# Patient Record
Sex: Female | Born: 1985 | Race: White | Hispanic: No | Marital: Single | Smoking: Never smoker
Health system: Southern US, Community
[De-identification: ages and names within clinical notes are randomized; demographics above are authoritative.]

## PROBLEM LIST (undated history)

## (undated) DIAGNOSIS — M1990 Unspecified osteoarthritis, unspecified site: Secondary | ICD-10-CM

## (undated) DIAGNOSIS — K8050 Calculus of bile duct without cholangitis or cholecystitis without obstruction: Secondary | ICD-10-CM

## (undated) DIAGNOSIS — Z973 Presence of spectacles and contact lenses: Secondary | ICD-10-CM

## (undated) DIAGNOSIS — R51 Headache: Secondary | ICD-10-CM

## (undated) DIAGNOSIS — K219 Gastro-esophageal reflux disease without esophagitis: Secondary | ICD-10-CM

## (undated) DIAGNOSIS — J45909 Unspecified asthma, uncomplicated: Secondary | ICD-10-CM

## (undated) DIAGNOSIS — E282 Polycystic ovarian syndrome: Secondary | ICD-10-CM

## (undated) DIAGNOSIS — G4730 Sleep apnea, unspecified: Secondary | ICD-10-CM

## (undated) DIAGNOSIS — M19019 Primary osteoarthritis, unspecified shoulder: Secondary | ICD-10-CM

## (undated) DIAGNOSIS — K439 Ventral hernia without obstruction or gangrene: Secondary | ICD-10-CM

## (undated) DIAGNOSIS — E669 Obesity, unspecified: Secondary | ICD-10-CM

## (undated) DIAGNOSIS — R519 Headache, unspecified: Secondary | ICD-10-CM

## (undated) HISTORY — DX: Polycystic ovarian syndrome: E28.2

## (undated) HISTORY — PX: COLONOSCOPY WITH ESOPHAGOGASTRODUODENOSCOPY (EGD): SHX5779

## (undated) HISTORY — PX: WISDOM TOOTH EXTRACTION: SHX21

## (undated) HISTORY — PX: SHOULDER SURGERY: SHX246

---

## 2008-08-18 ENCOUNTER — Ambulatory Visit (HOSPITAL_BASED_OUTPATIENT_CLINIC_OR_DEPARTMENT_OTHER): Admission: RE | Admit: 2008-08-18 | Discharge: 2008-08-18 | Payer: Self-pay | Admitting: Family Medicine

## 2008-08-24 ENCOUNTER — Ambulatory Visit: Payer: Self-pay | Admitting: Internal Medicine

## 2010-03-08 ENCOUNTER — Emergency Department (HOSPITAL_COMMUNITY): Admission: EM | Admit: 2010-03-08 | Discharge: 2010-03-08 | Payer: Self-pay | Admitting: Family Medicine

## 2010-03-25 ENCOUNTER — Emergency Department (HOSPITAL_COMMUNITY): Admission: EM | Admit: 2010-03-25 | Discharge: 2010-03-26 | Payer: Self-pay | Admitting: Emergency Medicine

## 2010-04-07 ENCOUNTER — Encounter: Admission: RE | Admit: 2010-04-07 | Discharge: 2010-04-07 | Payer: Self-pay | Admitting: Family Medicine

## 2010-11-14 LAB — DIFFERENTIAL
Eosinophils Relative: 1 % (ref 0–5)
Lymphocytes Relative: 9 % — ABNORMAL LOW (ref 12–46)
Lymphs Abs: 0.8 10*3/uL (ref 0.7–4.0)
Neutro Abs: 7.3 10*3/uL (ref 1.7–7.7)

## 2010-11-14 LAB — URINE MICROSCOPIC-ADD ON

## 2010-11-14 LAB — CBC
MCH: 27.8 pg (ref 26.0–34.0)
RDW: 14.6 % (ref 11.5–15.5)

## 2010-11-14 LAB — URINALYSIS, ROUTINE W REFLEX MICROSCOPIC
Specific Gravity, Urine: 1.027 (ref 1.005–1.030)
pH: 5.5 (ref 5.0–8.0)

## 2010-11-14 LAB — LIPASE, BLOOD: Lipase: 20 U/L (ref 11–59)

## 2010-11-14 LAB — COMPREHENSIVE METABOLIC PANEL
ALT: 19 U/L (ref 0–35)
Albumin: 4.1 g/dL (ref 3.5–5.2)
Alkaline Phosphatase: 56 U/L (ref 39–117)
CO2: 28 mEq/L (ref 19–32)
Calcium: 9.3 mg/dL (ref 8.4–10.5)
Potassium: 4 mEq/L (ref 3.5–5.1)
Total Bilirubin: 0.9 mg/dL (ref 0.3–1.2)
Total Protein: 7.4 g/dL (ref 6.0–8.3)

## 2011-01-15 NOTE — Procedures (Signed)
NAME:  [name], [name]                  ACCOUNT NO.:  [id number]   MEDICAL RECORD NO.:  [id number]          PATIENT TYPE:  OUT   LOCATION:  SLEEP CENTER                 FACILITY:  [facility]   PHYSICIAN:  Clinton D. Maple Hudson, MD, FCCP, FACPDATE OF BIRTH:  21-Jun-1985   DATE OF STUDY:                            NOCTURNAL POLYSOMNOGRAM   REFERRING PHYSICIAN:   DATE OF STUDY:  August 18, 2008.   INDICATION FOR STUDY:  Hypersomnia with sleep apnea.   EPWORTH SLEEPINESS SCORE:  Epworth sleepiness score 8/24.  BMI 49.  Weight 275 pounds.  Height 63 inches.  Neck 16 inches.   MEDICATIONS:  Home medications is listed as temazepam.   SLEEP ARCHITECTURE:  Split study protocol.  During the diagnostic phase,  total sleep time was 127 minutes with sleep efficiency 78.4%.  Stage I  was 11.4%.  Stage II 88.6%.  Stages III and REM were absent.  Sleep  latency 22 minutes.  Awake after sleep onset 13 minutes.  Arousal index  73.2 indicating increased EEG arousal.  No bedtime medication was taken.   RESPIRATORY DATA:  Split study protocol.  Apnea-hypopnea index (AHI)  23.1 per hour.  A total of 49 events were scored including 6 obstructive  apneas, 2 central apneas, 2 mixed apneas, and 39 hypopneas.  Events were  recorded as nonsupine.  CPAP was titrated to 9 at CWP, AHI zero per  hour.  She shows a small ResMed mirage Quattro mask with heated  humidifier.   OXYGEN DATA:  Moderate snoring before CPAP with oxygen desaturation to a  nadir of 89%.  After CPAP control mean oxygen saturation held 96.5% on  room air.   CARDIAC DATA:  Normal sinus rhythm.   MOVEMENT-PARASOMNIA:  No significant movement disturbance.  No bathroom  trips.   IMPRESSIONS-RECOMMENDATIONS:  1. Moderate obstructive sleep apnea/hypopnea syndrome, apnea-hypopnea      index 23.1 per hour with nonsupine events.  Moderate snoring and      oxygen desaturation to a nadir of 89%.  2. Successful continuous positive airway pressure control  with 9      centimeters of water pressure, apnea-hypopnea index zero per hour.      She shows a small ResMed mirage Quattro mask with heated      humidifier.      Clinton D. Maple Hudson, MD, [facility], FACP  Diplomate, Biomedical engineer of Sleep Medicine  Electronically Signed     CDY/MEDQ  D:  08/25/2008 10:07:11  T:  08/25/2008 16:10:96  Job:  [id number]

## 2013-09-07 ENCOUNTER — Encounter (HOSPITAL_COMMUNITY): Payer: Self-pay | Admitting: Emergency Medicine

## 2013-09-07 ENCOUNTER — Emergency Department (INDEPENDENT_AMBULATORY_CARE_PROVIDER_SITE_OTHER)
Admission: EM | Admit: 2013-09-07 | Discharge: 2013-09-07 | Disposition: A | Discharge status: Home / Self Care | Payer: Self-pay | Source: Home / Self Care | Attending: Family Medicine | Admitting: Family Medicine

## 2013-09-07 DIAGNOSIS — J40 Bronchitis, not specified as acute or chronic: Secondary | ICD-10-CM

## 2013-09-07 HISTORY — DX: Unspecified asthma, uncomplicated: J45.909

## 2013-09-07 MED ORDER — HYDROCODONE-ACETAMINOPHEN 5-325 MG PO TABS: 0.5000 | ORAL_TABLET | Freq: Every evening | ORAL | Status: DC | PRN

## 2013-09-07 MED ORDER — IPRATROPIUM BROMIDE 0.06 % NA SOLN: 2.0000 | Freq: Four times a day (QID) | NASAL | Status: DC

## 2013-09-07 MED ORDER — PREDNISONE 10 MG PO TABS
30.0000 mg | ORAL_TABLET | Freq: Every day | ORAL | Status: DC
Start: 2013-09-07 — End: 2014-07-22

## 2013-09-07 NOTE — ED Provider Notes (Signed)
[name] is a 28 y.o. female who presents to Urgent Care today for a days of cough congestion sore throat and runny nose. Cough is mildly productive. Patient denies any nausea vomiting diarrhea or shortness of breath. She does have a medical history significant for asthma and has been using her albuterol which helps temporarily. She has tried also some over-the-counter medications which have helped a bit.  Of note patient has been told that she is elevated blood pressure in the past but has never been diagnosed as hypertensive. Past Medical History  Diagnosis Date  . Asthma    History  Substance Use Topics  . Smoking status: Never Smoker   . Smokeless tobacco: Not on file  . Alcohol Use: No   ROS as above Medications reviewed. No current facility-administered medications for this encounter.   Current Outpatient Prescriptions  Medication Sig Dispense Refill  . HYDROcodone-acetaminophen (NORCO/VICODIN) 5-325 MG per tablet Take 0.5 tablets by mouth at bedtime as needed (cough).  6 tablet  0  . ipratropium (ATROVENT) 0.06 % nasal spray Place 2 sprays into both nostrils 4 (four) times daily.  15 mL  1  . predniSONE (DELTASONE) 10 MG tablet Take 3 tablets (30 mg total) by mouth daily.  15 tablet  0    Exam:  BP 187/98  Pulse 92  Temp(Src) 98.2 F (36.8 C) (Oral)  Resp 20  SpO2 98%  LMP 08/18/2013 Gen: Well NAD, obese HEENT: EOMI,  MMM history pharynx with cobblestoning. Tympanic membranes are normal appearing bilaterally. Lungs: Normal work of breathing. CTABL Heart: RRR no MRG Abd: NABS, Soft. NT, ND Exts:  warm and well perfused.   No results found for this or any previous visit (from the past 24 hour(s)). No results found.  Assessment and Plan: 28 y.o. female with  1) bronchitis. Plan treatment with prednisone, Atrovent nasal spray, and albuterol. Additionally we'll use Norco containing cough medication as needed.  2) elevated blood pressure: Likely hypertensive.  Recommend followup with a primary care provider for this issue going forward.  Discussed warning signs or symptoms. Please see discharge instructions. Patient expresses understanding.    Rodolph BongEvan S Levenia Skalicky, MD 09/07/13 2056

## 2013-09-07 NOTE — Discharge Instructions (Signed)
Thank you for coming in today. Take prednisone daily for 5 days.  Use 1/2 tab of norco at bedtime for cough as needed.  Use atrovent nasal spray.  Call or go to the emergency room if you get worse, have trouble breathing, have chest pains, or palpitations.   Bronchitis Bronchitis is the body's way of reacting to injury and/or infection (inflammation) of the bronchi. Bronchi are the air tubes that extend from the windpipe into the lungs. If the inflammation becomes severe, it may cause shortness of breath. CAUSES  Inflammation may be caused by:  A virus.  Germs (bacteria).  Dust.  Allergens.  Pollutants and many other irritants. The cells lining the bronchial tree are covered with tiny hairs (cilia). These constantly beat upward, away from the lungs, toward the mouth. This keeps the lungs free of pollutants. When these cells become too irritated and are unable to do their job, mucus begins to develop. This causes the characteristic cough of bronchitis. The cough clears the lungs when the cilia are unable to do their job. Without either of these protective mechanisms, the mucus would settle in the lungs. Then you would develop pneumonia. Smoking is a common cause of bronchitis and can contribute to pneumonia. Stopping this habit is the single most important thing you can do to help yourself. TREATMENT   Your caregiver may prescribe an antibiotic if the cough is caused by bacteria. Also, medicines that open up your airways make it easier to breathe. Your caregiver may also recommend or prescribe an expectorant. It will loosen the mucus to be coughed up. Only take over-the-counter or prescription medicines for pain, discomfort, or fever as directed by your caregiver.  Removing whatever causes the problem (smoking, for example) is critical to preventing the problem from getting worse.  Cough suppressants may be prescribed for relief of cough symptoms.  Inhaled medicines may be prescribed to  help with symptoms now and to help prevent problems from returning.  For those with recurrent (chronic) bronchitis, there may be a need for steroid medicines. SEEK IMMEDIATE MEDICAL CARE IF:   During treatment, you develop more pus-like mucus (purulent sputum).  You have a fever.  You become progressively more ill.  You have increased difficulty breathing, wheezing, or shortness of breath. It is necessary to seek immediate medical care if you are elderly or sick from any other disease. MAKE SURE YOU:   Understand these instructions.  Will watch your condition.  Will get help right away if you are not doing well or get worse. Document Released: 08/16/2005 Document Revised: 04/18/2013 Document Reviewed: 04/10/2013 [facility] Patient Information 2014 [employer] [employer] [place].

## 2013-09-07 NOTE — ED Notes (Signed)
C/o nonproductive cough.  Sore throat.  Symptoms present x 7 days.  Denies fever, v/d.  Mild nausea from coughing.  No relief with otc meds.

## 2013-09-12 ENCOUNTER — Telehealth (HOSPITAL_COMMUNITY): Payer: Self-pay | Admitting: Family Medicine

## 2013-09-12 MED ORDER — AMOXICILLIN 500 MG PO CAPS: 500.0000 mg | ORAL_CAPSULE | Freq: Three times a day (TID) | ORAL | Status: DC

## 2013-09-12 NOTE — ED Notes (Signed)
Patient is not feeling much better.  No SOB fever or chills.  Called in Amoxicillin x 1 week.  If not getting better return to clinic.    Rodolph BongEvan S Asianna Brundage, MD 09/12/13 2002

## 2014-03-07 ENCOUNTER — Ambulatory Visit: Payer: Self-pay | Admitting: Dietician

## 2014-07-22 ENCOUNTER — Ambulatory Visit (INDEPENDENT_AMBULATORY_CARE_PROVIDER_SITE_OTHER): Payer: 59 | Admitting: Medical

## 2014-07-22 ENCOUNTER — Encounter: Payer: Self-pay | Admitting: Medical

## 2014-07-22 VITALS — BP 112/80 | HR 101 | Temp 98.1°F | Resp 16 | Ht 63.2 in | Wt 263.0 lb

## 2014-07-22 DIAGNOSIS — Z8701 Personal history of pneumonia (recurrent): Secondary | ICD-10-CM

## 2014-07-22 DIAGNOSIS — M25511 Pain in right shoulder: Secondary | ICD-10-CM

## 2014-07-22 DIAGNOSIS — J4520 Mild intermittent asthma, uncomplicated: Secondary | ICD-10-CM

## 2014-07-22 NOTE — Progress Notes (Addendum)
Subjective: Here as a new patient today.  Here for c/o right shoulder pain, worsening in recent months.  Right handed.  Needs ortho referral.  Pain started 10 years ago, attributes this to carrying heavy book bag over the years.  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   Worse in the last 12 months.  Has pain sitting, pain over night in sleep.  Worse with movement such as vacuuming, window cleaning, putting pressure against windows.   Went to see urgent care, had xray, had been referred to ortho, Dr[facility] Cle[facility]as [facility]Whit[facility].  Saw him recently, had MRI, and there was inflammation?  Had steroid injection including one on 07/05/2014.  Was advised if not improving, would need surgery, however, due to insurance, needs retro referral back to ortho.   Denies numbness, tingling, weakness, no neck pain, no back pain.  No pain otherwise .    Hx/o asthma, no frequent flareup but has had pneumonia on average once in the past few years.  No hx/o immunocompromised state, no hx/o diabetes.   Gets flu shot yearly.  No other c/o.  ROS as in subjective  Objective: Gen: wd, wn, nad Skin: unremarkable MSK: tender right shoulder at biceps origin, AC joint and posterior shoulder, pain with resisted flexion and abduction, otherwise no obvious deformity, no laxity, no swelling, other special tests negative, relatively normal ROM.   Rest of UE unremarkable. Neck: supple, nontender, normal ROM, no lymphadenopathy Lungs clear, no wheezes, rhonchi, rales Ext: no edema Neuro: normal sensation, strength of UE   Assessment: Encounter Diagnoses  Name Primary?  . Pain in joint, shoulder region, right Yes  . Asthma, mild intermittent, uncomplicated   . History of pneumonia     Plan: Pain in right shoulder - based on history, exam, referral to Dr. Cleophas DunkerWhitfield, [facility].  No MRI records to review today.   She already has appt for next week with  Dr. Cleophas DunkerWhitfield.  Asthma - mild intermittent, will request prior records  Hx/o pneumonia - will request prior records

## 2014-07-29 ENCOUNTER — Telehealth: Payer: Self-pay | Admitting: Internal Medicine

## 2014-07-29 NOTE — Telephone Encounter (Signed)
[facility] and the referral number is [id number]. I fax this over to Dr. Hoy RegisterWhitfield's office.

## 2014-07-29 NOTE — Telephone Encounter (Signed)
Pt called stating she needs a referral to the orthopedic. Her insurance card has been changed to our name

## 2014-08-01 ENCOUNTER — Encounter: Payer: Self-pay | Admitting: Medical

## 2014-10-28 NOTE — H&P (Addendum)
Samantha CampbellPeter Whitfield, MD   Samantha CodeBrian Petrarca, PA-C [address], [place], [place]  [id number]                             [phone]   ORTHOPAEDIC HISTORY & PHYSICAL  [name] MRN:  [id number] DOB/SEX:  04/14/1986/female  CHIEF COMPLAINT:  Painful right shoulder pain  HISTORY: [name] is a 29 year old white female who is seen today for evaluation of her right shoulder.  I saw her back in the urgent care on 06/05/2014.  At that time I felt that she may have had an impingement-type syndrome versus labral tear of the right shoulder.  She underwent a subacromial injection at that time, and she states that this really did not have much improvement.  Therefore, an MRI arthrogram was ordered.  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  Therefore, she returned, and at that time she underwent an AC injection by Dr. Cleophas DunkerWhitfield and did not have a lot of improvement at that time. Her MRI scan did reveal AC degenerative changes but no labral tears    PAST MEDICAL HISTORY: There are no active problems to display for this patient.  Past Medical History  Diagnosis Date  . Asthma    No past surgical history on file.   MEDICATIONS:   No prescriptions prior to admission    ALLERGIES:   Allergies  Allergen Reactions  . Azithromycin   . Codeine     REVIEW OF SYSTEMS:  A comprehensive review of systems was negative.   FAMILY HISTORY:  No family history on file.  SOCIAL HISTORY:   History  Substance Use Topics  . Smoking status: Never Smoker   . Smokeless tobacco: Not on file  . Alcohol Use: No      EXAMINATION: Vital signs in last 24 hours:    Head is normocephalic.   Eyes:  Pupils equal, round and reactive to light and accommodation.  Extraocular intact. ENT: Ears, nose, and throat were benign.   Neck: supple, no bruits were noted.   Chest: good expansion.   Lungs: essentially clear.   Cardiac:  regular rhythm and rate, normal S1, S2.  No murmurs appreciated. Pulses :  2+ bilateral and symmetric in upper extremities. Abdomen is scaphoid, soft, nontender, no masses palpable, normal bowel sounds                  present. CNS:  He is oriented x3 and cranial nerves II-XII grossly intact. Breast, rectal, and genital exams: not performed and not indicated for an orthopedic evaluation. Musculoskeletal: [name] seems to be localizing her pain to the [name] joint.  She has a positive cross-arm test, pain in the area of the SI joint.  She does have positive impingement and she could raise her arm over her head, but it was a little bit circuitous.     Imaging Review Her MRI scan did reveal AC degenerative changes but no labral tears  ASSESSMENT: right combination of impingement and degenerative changes at the [facility] joint with edema in the distal clavicle.   Past Medical History  Diagnosis Date  . Asthma     PLAN: Plan for right arthroscopic SAD and DCR  The procedure,  risks, and benefits of surgery were presented and reviewed. The risks including but not limited to infection, blood clots, vascular and nerve injury,  stiffness,  among others were discussed. The patient acknowledged the explanation, agreed to proceed.   Oris Drone Aleda Grana [facility] [phone]  10/28/2014 4:23 PM

## 2014-11-20 ENCOUNTER — Encounter (HOSPITAL_BASED_OUTPATIENT_CLINIC_OR_DEPARTMENT_OTHER): Payer: Self-pay | Admitting: *Deleted

## 2014-11-20 NOTE — Progress Notes (Signed)
Chart reviewed by Dr Ivin Bootyrews concerning OSA, no CPAP use. He would like for patient to come in for an anesthesia consult. Patient called and she will come in tomorrow 11-21-14.

## 2014-11-21 NOTE — Progress Notes (Signed)
Evaluation by Dr Ivin Bootyrews = [facility] but is requiring pt to spend night.  Pt understands and agrees with decision to spend night for overnight observation.

## 2014-11-26 ENCOUNTER — Ambulatory Visit (HOSPITAL_BASED_OUTPATIENT_CLINIC_OR_DEPARTMENT_OTHER)
Admission: RE | Admit: 2014-11-26 | Discharge: 2014-11-27 | Disposition: A | Discharge status: Home / Self Care | Payer: 59 | Source: Ambulatory Visit | Attending: Orthopaedic Surgery | Admitting: Orthopaedic Surgery

## 2014-11-26 ENCOUNTER — Encounter (HOSPITAL_BASED_OUTPATIENT_CLINIC_OR_DEPARTMENT_OTHER): Payer: Self-pay

## 2014-11-26 ENCOUNTER — Ambulatory Visit (HOSPITAL_BASED_OUTPATIENT_CLINIC_OR_DEPARTMENT_OTHER): Payer: 59 | Admitting: Anesthesiology

## 2014-11-26 ENCOUNTER — Encounter (HOSPITAL_BASED_OUTPATIENT_CLINIC_OR_DEPARTMENT_OTHER)
Admission: RE | Disposition: A | Discharge status: Home / Self Care | Payer: Self-pay | Source: Ambulatory Visit | Attending: Orthopaedic Surgery

## 2014-11-26 DIAGNOSIS — J45909 Unspecified asthma, uncomplicated: Secondary | ICD-10-CM | POA: Insufficient documentation

## 2014-11-26 DIAGNOSIS — M19011 Primary osteoarthritis, right shoulder: Secondary | ICD-10-CM | POA: Diagnosis not present

## 2014-11-26 DIAGNOSIS — G4730 Sleep apnea, unspecified: Secondary | ICD-10-CM | POA: Insufficient documentation

## 2014-11-26 DIAGNOSIS — Z6841 Body Mass Index (BMI) 40.0 and over, adult: Secondary | ICD-10-CM | POA: Diagnosis not present

## 2014-11-26 DIAGNOSIS — M19019 Primary osteoarthritis, unspecified shoulder: Secondary | ICD-10-CM | POA: Diagnosis present

## 2014-11-26 DIAGNOSIS — Z9889 Other specified postprocedural states: Secondary | ICD-10-CM

## 2014-11-26 DIAGNOSIS — M7541 Impingement syndrome of right shoulder: Secondary | ICD-10-CM | POA: Diagnosis present

## 2014-11-26 HISTORY — DX: Headache: R51

## 2014-11-26 HISTORY — DX: Unspecified osteoarthritis, unspecified site: M19.90

## 2014-11-26 HISTORY — DX: Sleep apnea, unspecified: G47.30

## 2014-11-26 HISTORY — DX: Primary osteoarthritis, unspecified shoulder: M19.019

## 2014-11-26 HISTORY — DX: Headache, unspecified: R51.9

## 2014-11-26 SURGERY — SHOULDER ARTHROSCOPY WITH SUBACROMIAL DECOMPRESSION AND DISTAL CLAVICLE EXCISION
Anesthesia: Regional | Site: Shoulder | Laterality: Right

## 2014-11-26 MED ORDER — ACETAMINOPHEN 325 MG PO TABS: 650.0000 mg | ORAL_TABLET | Freq: Four times a day (QID) | ORAL | Status: DC | PRN

## 2014-11-26 MED ORDER — METOCLOPRAMIDE HCL 5 MG/ML IJ SOLN: 5.0000 mg | Freq: Three times a day (TID) | INTRAMUSCULAR | Status: DC | PRN

## 2014-11-26 MED ORDER — SODIUM CHLORIDE 0.9 % IV SOLN
INTRAVENOUS | Status: DC
  Administered 2014-11-26: 15:00:00 via INTRAVENOUS

## 2014-11-26 MED ORDER — OXYCODONE HCL 5 MG PO TABS: 5.0000 mg | ORAL_TABLET | Freq: Once | ORAL | Status: AC | PRN

## 2014-11-26 MED ORDER — HYDROMORPHONE HCL 1 MG/ML IJ SOLN: 0.5000 mg | INTRAMUSCULAR | Status: DC | PRN

## 2014-11-26 MED ORDER — SODIUM CHLORIDE 0.9 % IR SOLN
Status: DC | PRN
  Administered 2014-11-26: 18000 mL

## 2014-11-26 MED ORDER — ACETAMINOPHEN 500 MG PO TABS
1000.0000 mg | ORAL_TABLET | Freq: Four times a day (QID) | ORAL | Status: DC
  Administered 2014-11-26 – 2014-11-27 (×3): 1000 mg via ORAL
  Filled 2014-11-26 (×3): qty 2

## 2014-11-26 MED ORDER — CHLORHEXIDINE GLUCONATE 4 % EX LIQD: 60.0000 mL | Freq: Once | CUTANEOUS | Status: DC

## 2014-11-26 MED ORDER — FENTANYL CITRATE 0.05 MG/ML IJ SOLN
50.0000 ug | INTRAMUSCULAR | Status: DC | PRN
  Administered 2014-11-26: 100 ug via INTRAVENOUS

## 2014-11-26 MED ORDER — LIDOCAINE HCL 4 % MT SOLN
OROMUCOSAL | Status: DC | PRN
  Administered 2014-11-26: 2 mL via TOPICAL

## 2014-11-26 MED ORDER — SODIUM CHLORIDE 0.9 % IV SOLN: 75.0000 mL/h | INTRAVENOUS | Status: DC

## 2014-11-26 MED ORDER — SUCCINYLCHOLINE CHLORIDE 20 MG/ML IJ SOLN
INTRAMUSCULAR | Status: AC
  Filled 2014-11-26: qty 1

## 2014-11-26 MED ORDER — METHOCARBAMOL 500 MG PO TABS
500.0000 mg | ORAL_TABLET | Freq: Four times a day (QID) | ORAL | Status: DC | PRN
  Administered 2014-11-27: 500 mg via ORAL
  Filled 2014-11-26: qty 1

## 2014-11-26 MED ORDER — PROPOFOL 10 MG/ML IV BOLUS
INTRAVENOUS | Status: DC | PRN
  Administered 2014-11-26: 200 mg via INTRAVENOUS

## 2014-11-26 MED ORDER — ARTIFICIAL TEARS OP OINT
TOPICAL_OINTMENT | OPHTHALMIC | Status: DC | PRN
  Administered 2014-11-26: 1 via OPHTHALMIC

## 2014-11-26 MED ORDER — OXYCODONE HCL 5 MG/5ML PO SOLN: 5.0000 mg | Freq: Once | ORAL | Status: AC | PRN

## 2014-11-26 MED ORDER — LACTATED RINGERS IV SOLN
INTRAVENOUS | Status: DC
  Administered 2014-11-26 (×2): via INTRAVENOUS

## 2014-11-26 MED ORDER — ALBUTEROL SULFATE HFA 108 (90 BASE) MCG/ACT IN AERS: 1.0000 | INHALATION_SPRAY | Freq: Four times a day (QID) | RESPIRATORY_TRACT | Status: DC | PRN

## 2014-11-26 MED ORDER — OXYCODONE HCL 5 MG PO TABS
5.0000 mg | ORAL_TABLET | ORAL | Status: DC | PRN
  Administered 2014-11-27: 10 mg via ORAL
  Administered 2014-11-27: 5 mg via ORAL
  Administered 2014-11-27: 10 mg via ORAL
  Filled 2014-11-26 (×2): qty 2
  Filled 2014-11-26: qty 1

## 2014-11-26 MED ORDER — ONDANSETRON HCL 4 MG/2ML IJ SOLN
INTRAMUSCULAR | Status: DC | PRN
  Administered 2014-11-26: 4 mg via INTRAVENOUS

## 2014-11-26 MED ORDER — HYDROMORPHONE HCL 1 MG/ML IJ SOLN: 0.2500 mg | INTRAMUSCULAR | Status: DC | PRN

## 2014-11-26 MED ORDER — BUPIVACAINE-EPINEPHRINE (PF) 0.5% -1:200000 IJ SOLN
INTRAMUSCULAR | Status: DC | PRN
  Administered 2014-11-26: 25 mL via PERINEURAL

## 2014-11-26 MED ORDER — MIDAZOLAM HCL 2 MG/2ML IJ SOLN
1.0000 mg | INTRAMUSCULAR | Status: DC | PRN
  Administered 2014-11-26: 2 mg via INTRAVENOUS

## 2014-11-26 MED ORDER — ARTIFICIAL TEARS OP OINT: TOPICAL_OINTMENT | OPHTHALMIC | Status: DC | PRN

## 2014-11-26 MED ORDER — FENTANYL CITRATE 0.05 MG/ML IJ SOLN
INTRAMUSCULAR | Status: AC
  Filled 2014-11-26: qty 4

## 2014-11-26 MED ORDER — OXYCODONE-ACETAMINOPHEN 5-325 MG PO TABS: 1.0000 | ORAL_TABLET | ORAL | Status: DC | PRN

## 2014-11-26 MED ORDER — LIDOCAINE HCL (CARDIAC) 20 MG/ML IV SOLN
INTRAVENOUS | Status: DC | PRN
  Administered 2014-11-26: 60 mg via INTRAVENOUS

## 2014-11-26 MED ORDER — SUCCINYLCHOLINE CHLORIDE 20 MG/ML IJ SOLN
INTRAMUSCULAR | Status: DC | PRN
  Administered 2014-11-26: 100 mg via INTRAVENOUS

## 2014-11-26 MED ORDER — METHOCARBAMOL 1000 MG/10ML IJ SOLN: 500.0000 mg | Freq: Four times a day (QID) | INTRAVENOUS | Status: DC | PRN

## 2014-11-26 MED ORDER — ACETAMINOPHEN 10 MG/ML IV SOLN
1000.0000 mg | Freq: Once | INTRAVENOUS | Status: AC
  Administered 2014-11-26: 1000 mg via INTRAVENOUS

## 2014-11-26 MED ORDER — FENTANYL CITRATE 0.05 MG/ML IJ SOLN
INTRAMUSCULAR | Status: AC
  Filled 2014-11-26: qty 2

## 2014-11-26 MED ORDER — PHENTERMINE HCL 37.5 MG PO CAPS
37.5000 mg | ORAL_CAPSULE | ORAL | Status: DC
  Administered 2014-11-27: 37.5 mg via ORAL

## 2014-11-26 MED ORDER — MIDAZOLAM HCL 2 MG/2ML IJ SOLN
INTRAMUSCULAR | Status: AC
  Filled 2014-11-26: qty 2

## 2014-11-26 MED ORDER — ONDANSETRON HCL 4 MG/2ML IJ SOLN: 4.0000 mg | Freq: Four times a day (QID) | INTRAMUSCULAR | Status: DC | PRN

## 2014-11-26 MED ORDER — DEXAMETHASONE SODIUM PHOSPHATE 4 MG/ML IJ SOLN
INTRAMUSCULAR | Status: DC | PRN
  Administered 2014-11-26: 10 mg via INTRAVENOUS

## 2014-11-26 MED ORDER — ONDANSETRON HCL 4 MG/2ML IJ SOLN: 4.0000 mg | Freq: Once | INTRAMUSCULAR | Status: AC | PRN

## 2014-11-26 MED ORDER — PROPOFOL 10 MG/ML IV EMUL
INTRAVENOUS | Status: AC
  Filled 2014-11-26: qty 50

## 2014-11-26 MED ORDER — METOCLOPRAMIDE HCL 5 MG PO TABS
5.0000 mg | ORAL_TABLET | Freq: Three times a day (TID) | ORAL | Status: DC | PRN
Start: 2014-11-26 — End: 2014-11-27

## 2014-11-26 MED ORDER — ACETAMINOPHEN 10 MG/ML IV SOLN
INTRAVENOUS | Status: AC
  Filled 2014-11-26: qty 100

## 2014-11-26 MED ORDER — LEVONORGESTREL-ETHINYL ESTRAD 0.1-20 MG-MCG PO TABS
1.0000 | ORAL_TABLET | Freq: Every day | ORAL | Status: DC
  Administered 2014-11-26: 1 via ORAL

## 2014-11-26 MED ORDER — ACETAMINOPHEN 650 MG RE SUPP: 650.0000 mg | Freq: Four times a day (QID) | RECTAL | Status: DC | PRN

## 2014-11-26 MED ORDER — ONDANSETRON HCL 4 MG PO TABS: 4.0000 mg | ORAL_TABLET | Freq: Four times a day (QID) | ORAL | Status: DC | PRN

## 2014-11-26 SURGICAL SUPPLY — 78 items
BAG DECANTER FOR FLEXI CONT (MISCELLANEOUS) IMPLANT
BENZOIN TINCTURE PRP APPL 2/3 (GAUZE/BANDAGES/DRESSINGS) IMPLANT
BLADE 4.2CUDA (BLADE) IMPLANT
BLADE AVERAGE 25MMX9MM (BLADE)
BLADE AVERAGE 25X9 (BLADE) IMPLANT
BLADE CUDA 5.5 (BLADE) IMPLANT
BLADE GREAT WHITE 4.2 (BLADE) IMPLANT
BLADE GREAT WHITE 4.2MM (BLADE)
BLADE SURG 15 STRL LF DISP TIS (BLADE) IMPLANT
BLADE SURG 15 STRL SS (BLADE)
BUR OVAL 6.0 (BURR) ×3 IMPLANT
CANNULA 5.75X71 LONG (CANNULA) IMPLANT
CANNULA ACUFLEX KIT 5X76 (CANNULA) ×3 IMPLANT
CANNULA TWIST IN 8.25X7CM (CANNULA) IMPLANT
CLEANER CAUTERY TIP 5X5 PAD (MISCELLANEOUS) IMPLANT
CLOSURE WOUND 1/2 X4 (GAUZE/BANDAGES/DRESSINGS)
CUTTER MENISCUS  4.2MM (BLADE)
CUTTER MENISCUS 4.2MM (BLADE) IMPLANT
DECANTER SPIKE VIAL GLASS SM (MISCELLANEOUS) IMPLANT
DRAPE SHOULDER BEACH CHAIR (DRAPES) ×3 IMPLANT
DRAPE SURG 17X23 STRL (DRAPES) ×3 IMPLANT
DRAPE U-SHAPE 47X51 STRL (DRAPES) ×3 IMPLANT
DRSG EMULSION OIL 3X3 NADH (GAUZE/BANDAGES/DRESSINGS) ×3 IMPLANT
DRSG PAD ABDOMINAL 8X10 ST (GAUZE/BANDAGES/DRESSINGS) ×6 IMPLANT
DURAPREP 26ML APPLICATOR (WOUND CARE) ×3 IMPLANT
ELECT NEEDLE TIP 2.8 STRL (NEEDLE) IMPLANT
ELECT REM PT RETURN 9FT ADLT (ELECTROSURGICAL) ×3
ELECTRODE REM PT RTRN 9FT ADLT (ELECTROSURGICAL) ×1 IMPLANT
GAUZE SPONGE 4X4 12PLY STRL (GAUZE/BANDAGES/DRESSINGS) ×3 IMPLANT
GAUZE SPONGE 4X4 16PLY XRAY LF (GAUZE/BANDAGES/DRESSINGS) IMPLANT
GLOVE BIO SURGEON STRL SZ 6.5 (GLOVE) ×2 IMPLANT
GLOVE BIO SURGEON STRL SZ8.5 (GLOVE) ×3 IMPLANT
GLOVE BIO SURGEONS STRL SZ 6.5 (GLOVE) ×1
GLOVE BIOGEL PI IND STRL 7.0 (GLOVE) ×2 IMPLANT
GLOVE BIOGEL PI IND STRL 8 (GLOVE) ×1 IMPLANT
GLOVE BIOGEL PI INDICATOR 7.0 (GLOVE) ×4
GLOVE BIOGEL PI INDICATOR 8 (GLOVE) ×2
GLOVE ECLIPSE 6.5 STRL STRAW (GLOVE) ×3 IMPLANT
GLOVE ECLIPSE 8.0 STRL XLNG CF (GLOVE) ×3 IMPLANT
GOWN STRL REUS W/ TWL LRG LVL3 (GOWN DISPOSABLE) ×2 IMPLANT
GOWN STRL REUS W/ TWL XL LVL3 (GOWN DISPOSABLE) ×1 IMPLANT
GOWN STRL REUS W/TWL 2XL LVL3 (GOWN DISPOSABLE) ×3 IMPLANT
GOWN STRL REUS W/TWL LRG LVL3 (GOWN DISPOSABLE) ×4
GOWN STRL REUS W/TWL XL LVL3 (GOWN DISPOSABLE) ×2
MANIFOLD NEPTUNE II (INSTRUMENTS) IMPLANT
NEEDLE 1/2 CIR CATGUT .05X1.09 (NEEDLE) IMPLANT
NEEDLE SCORPION MULTI FIRE (NEEDLE) IMPLANT
NS IRRIG 1000ML POUR BTL (IV SOLUTION) IMPLANT
PACK ARTHROSCOPY DSU (CUSTOM PROCEDURE TRAY) ×3 IMPLANT
PACK BASIN DAY SURGERY FS (CUSTOM PROCEDURE TRAY) ×3 IMPLANT
PAD CLEANER CAUTERY TIP 5X5 (MISCELLANEOUS)
PENCIL BUTTON HOLSTER BLD 10FT (ELECTRODE) IMPLANT
SET ARTHROSCOPY TUBING (MISCELLANEOUS) ×2
SET ARTHROSCOPY TUBING LN (MISCELLANEOUS) ×1 IMPLANT
SLEEVE SCD COMPRESS KNEE MED (MISCELLANEOUS) ×3 IMPLANT
SLING ARM LRG ADULT FOAM STRAP (SOFTGOODS) ×3 IMPLANT
SLING ARM MED ADULT FOAM STRAP (SOFTGOODS) IMPLANT
SPONGE LAP 4X18 X RAY DECT (DISPOSABLE) IMPLANT
STAPLER VISISTAT 35W (STAPLE) IMPLANT
STRIP CLOSURE SKIN 1/2X4 (GAUZE/BANDAGES/DRESSINGS) IMPLANT
SUCTION FRAZIER TIP 10 FR DISP (SUCTIONS) IMPLANT
SUT BONE WAX W31G (SUTURE) IMPLANT
SUT ETHIBOND 2-0 V-5 NEEDLE (SUTURE) IMPLANT
SUT ETHILON 3 0 PS 1 (SUTURE) IMPLANT
SUT ETHILON 4 0 PS 2 18 (SUTURE) ×3 IMPLANT
SUT MNCRL AB 3-0 PS2 18 (SUTURE) IMPLANT
SUT PROLENE 3 0 PS 2 (SUTURE) IMPLANT
SUT VIC AB 0 CT1 27 (SUTURE)
SUT VIC AB 0 CT1 27XBRD ANBCTR (SUTURE) IMPLANT
SUT VIC AB 0 SH 27 (SUTURE) IMPLANT
SUT VIC AB 2-0 PS2 27 (SUTURE) IMPLANT
SUT VIC AB 2-0 SH 27 (SUTURE)
SUT VIC AB 2-0 SH 27XBRD (SUTURE) IMPLANT
SYR BULB 3OZ (MISCELLANEOUS) IMPLANT
TOWEL OR 17X24 6PK STRL BLUE (TOWEL DISPOSABLE) ×3 IMPLANT
WAND STAR VAC 90 (SURGICAL WAND) ×3 IMPLANT
WATER STERILE IRR 1000ML POUR (IV SOLUTION) ×3 IMPLANT
YANKAUER SUCT BULB TIP NO VENT (SUCTIONS) IMPLANT

## 2014-11-26 NOTE — Anesthesia Procedure Notes (Addendum)
Anesthesia Regional Block:  Interscalene brachial plexus block  Pre-Anesthetic Checklist: ,, timeout performed, Correct Patient, Correct Site, Correct Laterality, Correct Procedure, Correct Position, site marked, Risks and benefits discussed,  Surgical consent,  Pre-op evaluation,  At surgeon's request and post-op pain management  Laterality: Right and Upper  Prep: chloraprep       Needles:  Injection technique: Single-shot  Needle Type: Echogenic Needle     Needle Length: 5cm 5 cm Needle Gauge: 21 and 21 G    Additional Needles:  Procedures: ultrasound guided (picture in chart) Interscalene brachial plexus block Narrative:  Start time: 11/26/2014 12:02 PM End time: 11/26/2014 12:08 PM Injection made incrementally with aspirations every 5 mL.  Performed by: Personally  Anesthesiologist: CREWS, DAVID   Procedure Name: Intubation Date/Time: 11/26/2014 12:28 PM Performed by: Baxter Flattery Pre-anesthesia Checklist: Patient identified, Emergency Drugs available, Suction available and Patient being monitored Patient Re-evaluated:Patient Re-evaluated prior to inductionOxygen Delivery Method: Circle System Utilized Preoxygenation: Pre-oxygenation with 100% oxygen Intubation Type: IV induction Ventilation: Mask ventilation without difficulty Laryngoscope Size: Miller and 2 Grade View: Grade I Tube type: Oral Tube size: 7.0 mm Number of attempts: 1 Airway Equipment and Method: Stylet and LTA kit utilized Placement Confirmation: ETT inserted through vocal cords under direct vision,  positive ETCO2 and breath sounds checked- equal and bilateral Secured at: 22 cm Tube secured with: Tape Dental Injury: Teeth and Oropharynx as per pre-operative assessment

## 2014-11-26 NOTE — H&P (Signed)
  The recent History & Physical has been reviewed. I have personally examined the patient today. There is no interval change to the documented History & Physical. The patient would like to proceed with the procedure.  Norlene CampbellWHITFIELD, Linh Johannes W 11/26/2014,  11:46 AM

## 2014-11-26 NOTE — Op Note (Signed)
PATIENT ID:      [name]  MRN:     [id number] DOB/AGE:    10/15/1985 / 29 y.o.       OPERATIVE REPORT    DATE OF PROCEDURE:  11/26/2014       PREOPERATIVE DIAGNOSIS:   RIGHT SHOULDER IMPINGEMENT, DJD AC JOINT                                                       Estimated body mass index is 46.79 kg/(m^2) as calculated from the following:   Height as of this encounter: 5' 3.5" (1.613 m).   Weight as of this encounter: 121.745 kg (268 lb 6.4 oz).     POSTOPERATIVE DIAGNOSIS:   Right Shoulder Impingment , DJD A-C Joint                                                                    Estimated body mass index is 46.79 kg/(m^2) as calculated from the following:   Height as of this encounter: 5' 3.5" (1.613 m).   Weight as of this encounter: 121.745 kg (268 lb 6.4 oz).     PROCEDURE:  Procedure(s): RIGHT SHOULDER ARTHROSCOPY WITH SUBACROMIAL DECOMPRESSION AND DISTAL CLAVICLE EXCISION      SURGEON:  Norlene CampbellPeter Shayna Eblen, MD    ASSISTANT:   Jacqualine CodeBrian Petrarca, PA-C   (Present and scrubbed throughout the case, critical for assistance with exposure, retraction, instrumentation, and closure.)          ANESTHESIA: regional and general     DRAINS: none :      TOURNIQUET TIME: * No tourniquets in log *    COMPLICATIONS:  None   CONDITION:  stable  PROCEDURE IN DETAIL: [id number]: [id number]    [name] 11/26/2014, 1:19 PM

## 2014-11-26 NOTE — Anesthesia Preprocedure Evaluation (Signed)
Anesthesia Evaluation  Patient identified by MRN, date of birth, ID band Patient awake    Reviewed: Allergy & Precautions, NPO status , Patient's Chart, lab work & pertinent test results  Airway Mallampati: I  TM Distance: >3 FB Neck ROM: Full    Dental  (+) Teeth Intact, Dental Advisory Given   Pulmonary asthma , sleep apnea ,  breath sounds clear to auscultation        Cardiovascular Rhythm:Regular Rate:Normal     Neuro/Psych    GI/Hepatic   Endo/Other  Morbid obesity  Renal/GU      Musculoskeletal   Abdominal   Peds  Hematology   Anesthesia Other Findings   Reproductive/Obstetrics                             Anesthesia Physical Anesthesia Plan  ASA: III  Anesthesia Plan: General   Post-op Pain Management:    Induction: Intravenous  Airway Management Planned: Oral ETT  Additional Equipment:   Intra-op Plan:   Post-operative Plan: Extubation in OR  Informed Consent: I have reviewed the patients History and Physical, chart, labs and discussed the procedure including the risks, benefits and alternatives for the proposed anesthesia with the patient or authorized representative who has indicated his/her understanding and acceptance.   Dental advisory given  Plan Discussed with: CRNA, Anesthesiologist and Surgeon  Anesthesia Plan Comments:         Anesthesia Quick Evaluation

## 2014-11-26 NOTE — Anesthesia Postprocedure Evaluation (Signed)
  Anesthesia Post-op Note  Patient: Furniture conservator/restorerAmber N Azbell  Procedure(s) Performed: Procedure(s): RIGHT SHOULDER ARTHROSCOPY WITH SUBACROMIAL DECOMPRESSION AND DISTAL CLAVICLE EXCISION (Right)  Patient Location: PACU  Anesthesia Type: General, Regional   Level of Consciousness: awake, alert  and oriented  Airway and Oxygen Therapy: Patient Spontanous Breathing  Post-op Pain: none  Post-op Assessment: Post-op Vital signs reviewed  Post-op Vital Signs: Reviewed  Last Vitals:  Filed Vitals:   11/26/14 1415  BP: 149/82  Pulse: 77  Temp:   Resp: 18    Complications: No apparent anesthesia complications

## 2014-11-26 NOTE — Transfer of Care (Signed)
Immediate Anesthesia Transfer of Care Note  Patient: [name]  Procedure(s) Performed: Procedure(s): RIGHT SHOULDER ARTHROSCOPY WITH SUBACROMIAL DECOMPRESSION AND DISTAL CLAVICLE EXCISION (Right)  Patient Location: PACU  Anesthesia Type:GA combined with regional for post-op pain  Level of Consciousness: awake, alert , oriented and patient cooperative  Airway & Oxygen Therapy: Patient Spontanous Breathing and Patient connected to face mask oxygen  Post-op Assessment: Report given to RN, Post -op Vital signs reviewed and stable and Patient moving all extremities  Post vital signs: Reviewed and stable  Last Vitals:  Filed Vitals:   11/26/14 1200  BP: 134/80  Pulse: 97  Temp:   Resp: 20    Complications: No apparent anesthesia complications

## 2014-11-27 DIAGNOSIS — M7541 Impingement syndrome of right shoulder: Secondary | ICD-10-CM | POA: Diagnosis not present

## 2014-11-28 NOTE — Op Note (Signed)
[name], [name]                 ACCOUNT NO.:  [id number]  MEDICAL RECORD NO.:  [id number]  LOCATION:                                 FACILITY:  PHYSICIAN:  Vonna Kotyk. Whitfield, M.D.DATE OF BIRTH:  02/22/1986  DATE OF PROCEDURE:  11/26/2014 DATE OF DISCHARGE:  11/26/2014                              OPERATIVE REPORT   PREOPERATIVE DIAGNOSIS:  Impingement syndrome, right shoulder with degenerative joint disease, acromioclavicular joint.  POSTOPERATIVE DIAGNOSIS:  Impingement syndrome, right shoulder with degenerative joint disease, acromioclavicular joint.  PROCEDURES: 1. Examination of right shoulder under anesthesia. 2. Diagnostic arthroscopy, right shoulder. 3. Arthroscopic subacromial decompression. 4. Arthroscopic distal clavicle resection.  SURGEON:  Vonna Kotyk. Durward Fortes, MD  ASSISTANT:  Mike Craze. Petrarca, P.A.-C.  ANESTHESIA:  General with supplemental interscalene nerve block.  COMPLICATIONS:  None.  HISTORY:  A 29 year old female has had trouble off and on with the right shoulder for several years.  She denies any history of injury or trauma. Symptoms are consistent with impingement, but she has not responded to exercises and a subacromial cortisone injection.  She has had a recent MRI scan revealing some degenerative changes of the [facility] joint with edema at the end of the clavicle.  I have injected the [facility] joint.  She still did not have much relief.  Now, she is to have an arthroscopic evaluation.  The remainder of her MRI scan was normal.  There was no evidence of significant subdeltoid or subacromial bursal fluid.  No evidence of a labral tear.  The supraspinatus, infraspinatus, teres minor, and subscapularis muscles were all intact.  DESCRIPTION OF PROCEDURE:  Ms. [name] was met in the holding area, identified the right shoulder as appropriate operative site and marked it accordingly.  Anesthesia performed an interscalene nerve block.  The patient was then taken  to room #1 and placed under general anesthesia without difficulty.  She was placed in a semi-sitting position with the shoulder frame.  Exam of the right shoulder without evidence of instability or adhesive capsulitis.  The shoulder was then prepped with DuraPrep and the base of the neck circumferentially to below the elbow.  Sterile draping was performed. Time-out was called.  Marking pen was used to outline the [facility] joint, the coracoid and the acromion, at a point of fingerbreadth posterior and medial to the posterior angle of acromion where a small stab wound was made. Arthroscope was easily placed into the shoulder joint.  Diagnostic arthroscopy revealed an intact labrum.  I did not see any appreciable chondromalacia of the humeral head or glenoid.  There were no loose bodies.  The subscapularis was intact as was the biceps tendon. I did not see evidence of a rotator cuff tear.  There was no appreciable synovitis.  The arthroscope was then placed in subacromial space posteriorly with a cannula in the subacromial space anteriorly and a third portal was established in the lateral subacromial space.  An arthroscopic subacromial decompression was performed.  There was a considerable amount of inflammatory bursal tissue particularly in the anterior gutter.  This was debrided with the ArthroCare wand.  There was also considerable anterior impingement of the acromion.  I used  a 6-mm bur to perform an acromioplasty.  There was also some impingement laterally, and this was decompressed as well with the same 6-mm bur.  The [facility] joint capsule was partially intact.  There were areas of synovitis about the distal clavicle.  Based on the MRI scan with edema in the distal clavicle, distal clavicle resection was performed with the same 6-mm bur, there a very nice resection and flat.  Bleeding was controlled with the ArthroCare wand.  I then inspected the subacromial space.  I did not see any  evidence of a bursal surface tearing of the cuff.  The patient tolerated the procedure well without complications.  We will send or keep her overnight based on her size with BMI 46 and history of sleep apnea that has been untreated, Percocet for pain, to the office at the end of the week.     Vonna Kotyk. Durward Fortes, M.D.     PWW/MEDQ  D:  11/26/2014  T:  11/27/2014  Job:  [id number]

## 2016-03-11 ENCOUNTER — Other Ambulatory Visit: Payer: Self-pay | Admitting: Gastroenterology

## 2016-03-11 DIAGNOSIS — R110 Nausea: Secondary | ICD-10-CM

## 2016-04-08 ENCOUNTER — Encounter (HOSPITAL_COMMUNITY)
Admission: RE | Admit: 2016-04-08 | Discharge: 2016-04-08 | Disposition: A | Discharge status: Home / Self Care | Payer: BLUE CROSS/BLUE SHIELD | Source: Ambulatory Visit | Attending: Gastroenterology | Admitting: Gastroenterology

## 2016-04-08 ENCOUNTER — Other Ambulatory Visit: Payer: Self-pay | Admitting: Gastroenterology

## 2016-04-08 ENCOUNTER — Ambulatory Visit (HOSPITAL_COMMUNITY)
Admission: RE | Admit: 2016-04-08 | Discharge: 2016-04-08 | Disposition: A | Discharge status: Home / Self Care | Payer: BLUE CROSS/BLUE SHIELD | Source: Ambulatory Visit | Attending: Gastroenterology | Admitting: Gastroenterology

## 2016-04-08 ENCOUNTER — Encounter (HOSPITAL_COMMUNITY): Payer: Self-pay | Admitting: Radiology

## 2016-04-08 DIAGNOSIS — R110 Nausea: Secondary | ICD-10-CM | POA: Insufficient documentation

## 2016-04-08 MED ORDER — TECHNETIUM TC 99M MEBROFENIN IV KIT
5.4000 | PACK | Freq: Once | INTRAVENOUS | Status: AC | PRN
  Administered 2016-04-08: 5 via INTRAVENOUS

## 2016-04-23 ENCOUNTER — Encounter (HOSPITAL_COMMUNITY)
Admission: RE | Admit: 2016-04-23 | Discharge: 2016-04-23 | Disposition: A | Discharge status: Home / Self Care | Payer: BLUE CROSS/BLUE SHIELD | Source: Ambulatory Visit | Attending: Gastroenterology | Admitting: Gastroenterology

## 2016-04-23 DIAGNOSIS — R110 Nausea: Secondary | ICD-10-CM | POA: Insufficient documentation

## 2016-04-23 MED ORDER — TECHNETIUM TC 99M SULFUR COLLOID
2.2000 | Freq: Once | INTRAVENOUS | Status: AC | PRN
  Administered 2016-04-23: 2.2 via ORAL

## 2016-05-28 ENCOUNTER — Other Ambulatory Visit: Payer: Self-pay | Admitting: Surgery

## 2016-06-18 ENCOUNTER — Encounter (HOSPITAL_COMMUNITY): Payer: Self-pay

## 2016-06-18 ENCOUNTER — Encounter (HOSPITAL_COMMUNITY)
Admission: RE | Admit: 2016-06-18 | Discharge: 2016-06-18 | Disposition: A | Discharge status: Home / Self Care | Payer: BLUE CROSS/BLUE SHIELD | Source: Ambulatory Visit | Attending: Surgery | Admitting: Surgery

## 2016-06-18 DIAGNOSIS — K8051 Calculus of bile duct without cholangitis or cholecystitis with obstruction: Secondary | ICD-10-CM | POA: Diagnosis not present

## 2016-06-18 DIAGNOSIS — Z01812 Encounter for preprocedural laboratory examination: Secondary | ICD-10-CM | POA: Insufficient documentation

## 2016-06-18 HISTORY — DX: Ventral hernia without obstruction or gangrene: K43.9

## 2016-06-18 HISTORY — DX: Gastro-esophageal reflux disease without esophagitis: K21.9

## 2016-06-18 HISTORY — DX: Obesity, unspecified: E66.9

## 2016-06-18 HISTORY — DX: Calculus of bile duct without cholangitis or cholecystitis without obstruction: K80.50

## 2016-06-18 HISTORY — DX: Presence of spectacles and contact lenses: Z97.3

## 2016-06-18 LAB — CBC
HEMATOCRIT: 41.2 % (ref 36.0–46.0)
HEMOGLOBIN: 14.4 g/dL (ref 12.0–15.0)
MCH: 28.3 pg (ref 26.0–34.0)
MCHC: 35 g/dL (ref 30.0–36.0)
MCV: 81.1 fL (ref 78.0–100.0)
Platelets: 228 10*3/uL (ref 150–400)
RBC: 5.08 MIL/uL (ref 3.87–5.11)
RDW: 12.3 % (ref 11.5–15.5)
WBC: 6.5 10*3/uL (ref 4.0–10.5)

## 2016-06-18 LAB — HCG, SERUM, QUALITATIVE: Preg, Serum: NEGATIVE

## 2016-06-18 NOTE — Progress Notes (Signed)
Pt denies SOB, chest pain, and being under the care of a cardiologist. Pt denies having an echo, stress test and cardiac cath. Pt denies having an EKG and chest x ray within the last year. Pt denies having any recent labs.

## 2016-06-18 NOTE — Pre-Procedure Instructions (Signed)
    [employer]  06/18/2016      [id number] - [place], [place] - [address] AT [facility] [address] [place] [id number] Phone: [phone] Fax: [phone]    Your procedure is scheduled on Wednesday, June 23, 2016  [facility] Admitting at 6:30 A.M.  Call this number if you have problems the morning of surgery:  [phone]   Remember:  Do not eat food or drink liquids after midnight Tuesday, October 24, 207  Take these medicines the morning of surgery with A SIP OF WATER :levonorgestrel-ethinyl estradiol (AVIANE,ALESSE,LESSINA), if needed: albuterol (PROVENTIL HFA;VENTOLIN HFA) inhaler for wheezing or shortness of breath ( bring inhaler in with you on day of surgery).  Stop taking Aspirin, vitamins, fish oil and herbal medications. Do not take any NSAIDs ie: Ibuprofen, Advil, Naproxen, BC and Goody Powder or any medication containing Aspirin; stop now.   Do not wear jewelry, make-up or nail polish.  Do not wear lotions, powders, or perfumes, or deoderant.  Do not shave 48 hours prior to surgery.   Do not bring valuables to the hospital.  [facility] is not responsible for any belongings or valuables.  Contacts, dentures or bridgework may not be worn into surgery.  Leave your suitcase in the car.  After surgery it may be brought to your room. For patients admitted to the hospital, discharge time will be determined by your treatment team. Patients discharged the day of surgery will not be allowed to drive home.  Special instructions: Shower the night before surgery and the morning of surgery with CHG. Please read over the following fact sheets that you were given. Pain Booklet, Coughing and Deep Breathing and Surgical Site Infection Prevention

## 2016-06-22 MED ORDER — DEXTROSE 5 % IV SOLN
3.0000 g | INTRAVENOUS | Status: AC
  Administered 2016-06-23: 3 g via INTRAVENOUS
  Filled 2016-06-22: qty 3000

## 2016-06-22 NOTE — H&P (Signed)
[name] 05/28/2016 11:38 AM Location: [facility] Patient #: [id number] DOB: Jun 07, 1986 Single / Language: [place] / Race: White Female   History of Present Illness (Kelcey Wickstrom A. Magnus Ivan MD; 05/28/2016 12:03 PM) The patient is a 30 year old female who presents with nausea. This is a very pleasant patient referred by Dr. Charna Elizabeth for evaluation of nausea. The patient has had severe nausea after fatty meals for one year. It is getting worse and worse and she started to lose weight. She has had an extensive workup including endoscopy, gastric emptying study, HIDA scan, and ultrasound which have all been unremarkable. Her gallbladder ejection fraction was 45%. She denies jaundice. Bowel movements are normal. There is been no abdominal pain.   Other Problems Gilmer Mor, CMA; 05/28/2016 11:38 AM) Asthma Gastroesophageal Reflux Disease  Past Surgical History Gilmer Mor, CMA; 05/28/2016 11:38 AM) Shoulder Surgery Right.  Diagnostic Studies History Gilmer Mor, CMA; 05/28/2016 11:38 AM) Mammogram never Pap Smear 1-5 years ago  Allergies Lamar Laundry Bynum, CMA; 05/28/2016 11:39 AM) No Known Drug Allergies09/29/2017  Medication History Lamar Laundry Bynum, CMA; 05/28/2016 11:39 AM) Janann Colonel (0.1-20MG -MCG Tablet, Oral) Active. Medications Reconciled  Social History Gilmer Mor, CMA; 05/28/2016 11:38 AM) Alcohol use Moderate alcohol use. Caffeine use Carbonated beverages, Tea. No drug use Tobacco use Never smoker.  Family History Gilmer Mor, CMA; 05/28/2016 11:38 AM) Arthritis Mother. Heart disease in female family member before age 32 Hypertension Mother.  Pregnancy / Birth History Gilmer Mor, CMA; 05/28/2016 11:38 AM) Age at menarche 11 years. Contraceptive History Oral contraceptives. Gravida 0 Para 0 Regular periods    Review of Systems (Sonya Bynum CMA; 05/28/2016 11:38 AM) General Present- Appetite Loss. Not Present- Chills, Fatigue, Fever,  Night Sweats, Weight Gain and Weight Loss. HEENT Present- Wears glasses/contact lenses. Not Present- Earache, Hearing Loss, Hoarseness, Nose Bleed, Oral Ulcers, Ringing in the Ears, Seasonal Allergies, Sinus Pain, Sore Throat, Visual Disturbances and Yellow Eyes. Respiratory Not Present- Bloody sputum, Chronic Cough, Difficulty Breathing, Snoring and Wheezing. Breast Not Present- Breast Mass, Breast Pain, Nipple Discharge and Skin Changes. Cardiovascular Not Present- Chest Pain, Difficulty Breathing Lying Down, Leg Cramps, Palpitations, Rapid Heart Rate, Shortness of Breath and Swelling of Extremities. Gastrointestinal Present- Nausea. Not Present- Abdominal Pain, Bloating, Bloody Stool, Change in Bowel Habits, Chronic diarrhea, Constipation, Difficulty Swallowing, Excessive gas, Gets full quickly at meals, Hemorrhoids, Indigestion, Rectal Pain and Vomiting. Female Genitourinary Not Present- Frequency, Nocturia, Painful Urination, Pelvic Pain and Urgency. Musculoskeletal Not Present- Back Pain, Joint Pain, Joint Stiffness, Muscle Pain, Muscle Weakness and Swelling of Extremities. Neurological Not Present- Decreased Memory, Fainting, Headaches, Numbness, Seizures, Tingling, Tremor, Trouble walking and Weakness. Psychiatric Not Present- Anxiety, Bipolar, Change in Sleep Pattern, Depression, Fearful and Frequent crying. Endocrine Not Present- Cold Intolerance, Excessive Hunger, Hair Changes, Heat Intolerance, Hot flashes and New Diabetes. Hematology Not Present- Blood Thinners, Easy Bruising, Excessive bleeding, Gland problems, HIV and Persistent Infections.  Vitals (Sonya Bynum CMA; 05/28/2016 11:39 AM) 05/28/2016 11:38 AM Weight: 286 lb Height: 63in Body Surface Area: 2.25 m Body Mass Index: 50.66 kg/m  Temp.: 98.64F(Temporal)  Pulse: 76 (Regular)  BP: 128/80 (Sitting, Left Arm, Standard)       Physical Exam (Emira Eubanks A. Magnus Ivan MD; 05/28/2016 12:04 PM) General Mental  Status-Alert. General Appearance-Consistent with stated age. Hydration-Well hydrated. Voice-Normal.  Head and Neck Head-normocephalic, atraumatic with no lesions or palpable masses.  Eye Eyeball - Bilateral-Extraocular movements intact. Sclera/Conjunctiva - Bilateral-No scleral icterus.  Chest and Lung Exam Chest and lung exam reveals -quiet,  even and easy respiratory effort with no use of accessory muscles and on auscultation, normal breath sounds, no adventitious sounds and normal vocal resonance. Inspection Chest Wall - Normal. Back - normal.  Cardiovascular Cardiovascular examination reveals -on palpation PMI is normal in location and amplitude, no palpable S3 or S4. Normal cardiac borders., normal heart sounds, regular rate and rhythm with no murmurs, carotid auscultation reveals no bruits and normal pedal pulses bilaterally.  Abdomen Inspection Inspection of the abdomen reveals - No Hernias. Skin - Scar - no surgical scars. Palpation/Percussion Palpation and Percussion of the abdomen reveal - Soft, Non Tender, No Rebound tenderness, No Rigidity (guarding) and No hepatosplenomegaly. Auscultation Auscultation of the abdomen reveals - Bowel sounds normal.  Neurologic - Did not examine.  Musculoskeletal - Did not examine.    Assessment & Plan (Jaxsen Bernhart A. Magnus IvanBlackman MD; 05/28/2016 12:05 PM) BILIARY COLIC (K80.50) Impression: I believe this is biliary colic and I suspect she does have some chronic cholecystitis. I discussed gallbladder anatomy with her in detail gave her literature regarding this. I discussed continued conservative management versus cholecystectomy to see if this will help her symptoms. I gave her literature regarding the surgery. I discussed the versus surgery which includes but is not limited to bleeding, infection, bile duct injury, bile leak, the need to convert to an open procedure, cardiopulmonary issues, the chances that resolve her symptoms,  postoperative recovery, etc. She understands and is very eager to proceed with surgery given the severity of her nausea each I feel is very reasonable

## 2016-06-22 NOTE — Anesthesia Preprocedure Evaluation (Addendum)
Anesthesia Evaluation  Patient identified by MRN, date of birth, ID band Patient awake    Reviewed: Allergy & Precautions, NPO status , Patient's Chart, lab work & pertinent test results  Airway Mallampati: II  TM Distance: >3 FB Neck ROM: Full    Dental  (+) Teeth Intact, Dental Advisory Given   Pulmonary asthma , sleep apnea ,    Pulmonary exam normal breath sounds clear to auscultation       Cardiovascular Exercise Tolerance: Good negative cardio ROS Normal cardiovascular exam Rhythm:Regular Rate:Normal     Neuro/Psych  Headaches, PSYCHIATRIC DISORDERS Depression    GI/Hepatic Neg liver ROS, GERD  Medicated and Controlled,Cholecystitis   Endo/Other  Morbid obesity  Renal/GU negative Renal ROS     Musculoskeletal  (+) Arthritis ,   Abdominal   Peds  Hematology negative hematology ROS (+)   Anesthesia Other Findings Day of surgery medications reviewed with the patient.  Reproductive/Obstetrics negative OB ROS                            Anesthesia Physical Anesthesia Plan  ASA: III  Anesthesia Plan: General   Post-op Pain Management:    Induction: Intravenous  Airway Management Planned: Oral ETT  Additional Equipment:   Intra-op Plan:   Post-operative Plan: Extubation in OR  Informed Consent: I have reviewed the patients History and Physical, chart, labs and discussed the procedure including the risks, benefits and alternatives for the proposed anesthesia with the patient or authorized representative who has indicated his/her understanding and acceptance.   Dental advisory given  Plan Discussed with: CRNA  Anesthesia Plan Comments: (Risks/benefits of general anesthesia discussed with patient including risk of damage to teeth, lips, gum, and tongue, nausea/vomiting, allergic reactions to medications, and the possibility of heart attack, stroke and death.  All patient  questions answered.  Patient wishes to proceed.)        Anesthesia Quick Evaluation

## 2016-06-23 ENCOUNTER — Ambulatory Visit (HOSPITAL_COMMUNITY)
Admission: RE | Admit: 2016-06-23 | Discharge: 2016-06-23 | Disposition: A | Discharge status: Home / Self Care | Payer: BLUE CROSS/BLUE SHIELD | Source: Ambulatory Visit | Attending: Surgery | Admitting: Surgery

## 2016-06-23 ENCOUNTER — Ambulatory Visit (HOSPITAL_COMMUNITY): Payer: BLUE CROSS/BLUE SHIELD | Admitting: Anesthesiology

## 2016-06-23 ENCOUNTER — Encounter (HOSPITAL_COMMUNITY)
Admission: RE | Disposition: A | Discharge status: Home / Self Care | Payer: Self-pay | Source: Ambulatory Visit | Attending: Surgery

## 2016-06-23 ENCOUNTER — Encounter (HOSPITAL_COMMUNITY): Payer: Self-pay | Admitting: Urology

## 2016-06-23 DIAGNOSIS — G4730 Sleep apnea, unspecified: Secondary | ICD-10-CM | POA: Diagnosis not present

## 2016-06-23 DIAGNOSIS — K219 Gastro-esophageal reflux disease without esophagitis: Secondary | ICD-10-CM | POA: Insufficient documentation

## 2016-06-23 DIAGNOSIS — K8044 Calculus of bile duct with chronic cholecystitis without obstruction: Secondary | ICD-10-CM | POA: Diagnosis not present

## 2016-06-23 DIAGNOSIS — J45909 Unspecified asthma, uncomplicated: Secondary | ICD-10-CM | POA: Diagnosis not present

## 2016-06-23 DIAGNOSIS — M1990 Unspecified osteoarthritis, unspecified site: Secondary | ICD-10-CM | POA: Diagnosis not present

## 2016-06-23 DIAGNOSIS — Z6841 Body Mass Index (BMI) 40.0 and over, adult: Secondary | ICD-10-CM | POA: Diagnosis not present

## 2016-06-23 HISTORY — PX: CHOLECYSTECTOMY: SHX55

## 2016-06-23 SURGERY — LAPAROSCOPIC CHOLECYSTECTOMY
Anesthesia: General | Site: Abdomen

## 2016-06-23 MED ORDER — BUPIVACAINE HCL (PF) 0.25 % IJ SOLN
INTRAMUSCULAR | Status: AC
  Filled 2016-06-23: qty 30

## 2016-06-23 MED ORDER — PROMETHAZINE HCL 25 MG/ML IJ SOLN: 6.2500 mg | INTRAMUSCULAR | Status: DC | PRN

## 2016-06-23 MED ORDER — FENTANYL CITRATE (PF) 100 MCG/2ML IJ SOLN
INTRAMUSCULAR | Status: AC
  Administered 2016-06-23: 50 ug via INTRAVENOUS
  Filled 2016-06-23: qty 2

## 2016-06-23 MED ORDER — LIDOCAINE HCL (CARDIAC) 20 MG/ML IV SOLN
INTRAVENOUS | Status: DC | PRN
  Administered 2016-06-23: 100 mg via INTRATRACHEAL

## 2016-06-23 MED ORDER — BUPIVACAINE HCL (PF) 0.25 % IJ SOLN
INTRAMUSCULAR | Status: DC | PRN
  Administered 2016-06-23: 20 mL

## 2016-06-23 MED ORDER — CHLORHEXIDINE GLUCONATE CLOTH 2 % EX PADS: 6.0000 | MEDICATED_PAD | Freq: Once | CUTANEOUS | Status: DC

## 2016-06-23 MED ORDER — SUGAMMADEX SODIUM 200 MG/2ML IV SOLN
INTRAVENOUS | Status: DC | PRN
  Administered 2016-06-23: 200 mg via INTRAVENOUS

## 2016-06-23 MED ORDER — LACTATED RINGERS IV SOLN
INTRAVENOUS | Status: DC | PRN
  Administered 2016-06-23: 08:00:00 via INTRAVENOUS

## 2016-06-23 MED ORDER — PROPOFOL 10 MG/ML IV BOLUS
INTRAVENOUS | Status: AC
  Filled 2016-06-23: qty 20

## 2016-06-23 MED ORDER — MIDAZOLAM HCL 5 MG/5ML IJ SOLN
INTRAMUSCULAR | Status: DC | PRN
  Administered 2016-06-23: 2 mg via INTRAVENOUS

## 2016-06-23 MED ORDER — ROCURONIUM BROMIDE 100 MG/10ML IV SOLN
INTRAVENOUS | Status: DC | PRN
  Administered 2016-06-23: 30 mg via INTRAVENOUS

## 2016-06-23 MED ORDER — ROCURONIUM BROMIDE 10 MG/ML (PF) SYRINGE
PREFILLED_SYRINGE | INTRAVENOUS | Status: AC
  Filled 2016-06-23: qty 10

## 2016-06-23 MED ORDER — OXYCODONE HCL 5 MG PO TABS
5.0000 mg | ORAL_TABLET | ORAL | Status: DC | PRN
  Administered 2016-06-23: 10 mg via ORAL

## 2016-06-23 MED ORDER — DIPHENHYDRAMINE HCL 50 MG/ML IJ SOLN
INTRAMUSCULAR | Status: DC | PRN
  Administered 2016-06-23: 25 mg via INTRAVENOUS

## 2016-06-23 MED ORDER — ONDANSETRON HCL 4 MG/2ML IJ SOLN
INTRAMUSCULAR | Status: AC
  Filled 2016-06-23: qty 2

## 2016-06-23 MED ORDER — ONDANSETRON HCL 4 MG/2ML IJ SOLN
INTRAMUSCULAR | Status: DC | PRN
  Administered 2016-06-23: 4 mg via INTRAVENOUS

## 2016-06-23 MED ORDER — HYDROCODONE-ACETAMINOPHEN 5-325 MG PO TABS: 1.0000 | ORAL_TABLET | ORAL | 0 refills | Status: DC | PRN

## 2016-06-23 MED ORDER — OXYCODONE HCL 5 MG PO TABS
ORAL_TABLET | ORAL | Status: AC
  Filled 2016-06-23: qty 2

## 2016-06-23 MED ORDER — SUCCINYLCHOLINE CHLORIDE 200 MG/10ML IV SOSY
PREFILLED_SYRINGE | INTRAVENOUS | Status: AC
  Filled 2016-06-23: qty 10

## 2016-06-23 MED ORDER — FENTANYL CITRATE (PF) 100 MCG/2ML IJ SOLN
INTRAMUSCULAR | Status: DC | PRN
  Administered 2016-06-23 (×2): 100 ug via INTRAVENOUS

## 2016-06-23 MED ORDER — SUCCINYLCHOLINE CHLORIDE 20 MG/ML IJ SOLN
INTRAMUSCULAR | Status: DC | PRN
  Administered 2016-06-23: 120 mg via INTRAVENOUS

## 2016-06-23 MED ORDER — SODIUM CHLORIDE 0.9 % IR SOLN
Status: DC | PRN
  Administered 2016-06-23: 1000 mL

## 2016-06-23 MED ORDER — FENTANYL CITRATE (PF) 100 MCG/2ML IJ SOLN
25.0000 ug | INTRAMUSCULAR | Status: DC | PRN
  Administered 2016-06-23 (×2): 50 ug via INTRAVENOUS

## 2016-06-23 MED ORDER — MIDAZOLAM HCL 2 MG/2ML IJ SOLN
INTRAMUSCULAR | Status: AC
  Filled 2016-06-23: qty 2

## 2016-06-23 MED ORDER — LIDOCAINE 2% (20 MG/ML) 5 ML SYRINGE
INTRAMUSCULAR | Status: AC
  Filled 2016-06-23: qty 5

## 2016-06-23 MED ORDER — DIPHENHYDRAMINE HCL 50 MG/ML IJ SOLN
INTRAMUSCULAR | Status: AC
  Filled 2016-06-23: qty 1

## 2016-06-23 MED ORDER — FENTANYL CITRATE (PF) 100 MCG/2ML IJ SOLN
INTRAMUSCULAR | Status: AC
  Filled 2016-06-23: qty 4

## 2016-06-23 MED ORDER — 0.9 % SODIUM CHLORIDE (POUR BTL) OPTIME
TOPICAL | Status: DC | PRN
  Administered 2016-06-23: 1000 mL

## 2016-06-23 MED ORDER — PROPOFOL 10 MG/ML IV BOLUS
INTRAVENOUS | Status: DC | PRN
  Administered 2016-06-23: 200 mg via INTRAVENOUS

## 2016-06-23 SURGICAL SUPPLY — 30 items
APPLIER CLIP 5 13 M/L LIGAMAX5 (MISCELLANEOUS) ×3
CANISTER SUCTION 2500CC (MISCELLANEOUS) ×3 IMPLANT
CHLORAPREP W/TINT 26ML (MISCELLANEOUS) ×3 IMPLANT
CLIP APPLIE 5 13 M/L LIGAMAX5 (MISCELLANEOUS) ×1 IMPLANT
COVER SURGICAL LIGHT HANDLE (MISCELLANEOUS) ×3 IMPLANT
DERMABOND ADVANCED (GAUZE/BANDAGES/DRESSINGS) ×2
DERMABOND ADVANCED .7 DNX12 (GAUZE/BANDAGES/DRESSINGS) ×1 IMPLANT
ELECT REM PT RETURN 9FT ADLT (ELECTROSURGICAL) ×3
ELECTRODE REM PT RTRN 9FT ADLT (ELECTROSURGICAL) ×1 IMPLANT
GLOVE SURG SIGNA 7.5 PF LTX (GLOVE) ×3 IMPLANT
GOWN STRL REUS W/ TWL LRG LVL3 (GOWN DISPOSABLE) ×2 IMPLANT
GOWN STRL REUS W/ TWL XL LVL3 (GOWN DISPOSABLE) ×1 IMPLANT
GOWN STRL REUS W/TWL LRG LVL3 (GOWN DISPOSABLE) ×4
GOWN STRL REUS W/TWL XL LVL3 (GOWN DISPOSABLE) ×2
KIT BASIN OR (CUSTOM PROCEDURE TRAY) ×3 IMPLANT
KIT ROOM TURNOVER OR (KITS) ×3 IMPLANT
NS IRRIG 1000ML POUR BTL (IV SOLUTION) ×3 IMPLANT
PAD ARMBOARD 7.5X6 YLW CONV (MISCELLANEOUS) ×3 IMPLANT
POUCH SPECIMEN RETRIEVAL 10MM (ENDOMECHANICALS) ×3 IMPLANT
SCISSORS LAP 5X35 DISP (ENDOMECHANICALS) ×3 IMPLANT
SET IRRIG TUBING LAPAROSCOPIC (IRRIGATION / IRRIGATOR) ×3 IMPLANT
SLEEVE ENDOPATH XCEL 5M (ENDOMECHANICALS) ×6 IMPLANT
SPECIMEN JAR SMALL (MISCELLANEOUS) ×3 IMPLANT
SUT MON AB 4-0 PC3 18 (SUTURE) ×3 IMPLANT
TOWEL OR 17X24 6PK STRL BLUE (TOWEL DISPOSABLE) ×3 IMPLANT
TOWEL OR 17X26 10 PK STRL BLUE (TOWEL DISPOSABLE) ×3 IMPLANT
TRAY LAPAROSCOPIC MC (CUSTOM PROCEDURE TRAY) ×3 IMPLANT
TROCAR XCEL BLUNT TIP 100MML (ENDOMECHANICALS) ×3 IMPLANT
TROCAR XCEL NON-BLD 5MMX100MML (ENDOMECHANICALS) ×3 IMPLANT
TUBING INSUFFLATION (TUBING) ×3 IMPLANT

## 2016-06-23 NOTE — Discharge Instructions (Signed)
[facility] ______[facility] LAPAROSCOPIC SURGERY: POST OP INSTRUCTIONS Always review your discharge instruction sheet given to you by the facility where your surgery was performed. IF YOU HAVE DISABILITY OR FAMILY LEAVE FORMS, YOU MUST BRING THEM TO THE OFFICE FOR PROCESSING.   DO NOT GIVE THEM TO YOUR DOCTOR.  1. A prescription for pain medication may be given to you upon discharge.  Take your pain medication as prescribed, if needed.  If narcotic pain medicine is not needed, then you may take acetaminophen (Tylenol) or ibuprofen (Advil) as needed. 2. Take your usually prescribed medications unless otherwise directed. 3. If you need a refill on your pain medication, please contact your pharmacy.  They will contact our office to request authorization. Prescriptions will not be filled after 5pm or on week-ends. 4. You should follow a light diet the first few days after arrival home, such as soup and crackers, etc.  Be sure to include lots of fluids daily. 5. Most patients will experience some swelling and bruising in the area of the incisions.  Ice packs will help.  Swelling and bruising can take several days to resolve.  6. It is common to experience some constipation if taking pain medication after surgery.  Increasing fluid intake and taking a stool softener (such as Colace) will usually help or prevent this problem from occurring.  A mild laxative (Milk of Magnesia or Miralax) should be taken according to package instructions if there are no bowel movements after 48 hours. 7. Unless discharge instructions indicate otherwise, you may remove your bandages 24-48 hours after surgery, and you may shower at that time.  You may have steri-strips (small skin tapes) in place directly over the incision.  These strips should be left on the skin for 7-10 days.  If your surgeon used skin glue on the incision, you may shower in 24 hours.  The glue will flake off over the next 2-3 weeks.  Any sutures or  staples will be removed at the office during your follow-up visit. 8. ACTIVITIES:  You may resume regular (light) daily activities beginning the next day--such as daily self-care, walking, climbing stairs--gradually increasing activities as tolerated.  You may have sexual intercourse when it is comfortable.  Refrain from any heavy lifting or straining until approved by your doctor. a. You may drive when you are no longer taking prescription pain medication, you can comfortably wear a seatbelt, and you can safely maneuver your car and apply brakes. b. RETURN TO WORK:  _OK TO RETURN TO WORK Monday, OCT 30TH_______________________________________________________ 9. You should see your doctor in the office for a follow-up appointment approximately 2-3 weeks after your surgery.  Make sure that you call for this appointment within a day or two after you arrive home to insure a convenient appointment time. 10. OTHER INSTRUCTIONS:IBUPROFEN AND ICE PACK OR HEATING PAD ALSO FOR PAIN __________________________________________________________________________________________________________________________ __________________________________________________________________________________________________________________________ WHEN TO CALL YOUR DOCTOR: 1. Fever over 101.0 2. Inability to urinate 3. Continued bleeding from incision. 4. Increased pain, redness, or drainage from the incision. 5. Increasing abdominal pain  The clinic staff is available to answer your questions during regular business hours.  Please dont hesitate to call and ask to speak to one of the nurses for clinical concerns.  If you have a medical emergency, go to the nearest emergency room or call 911.  A surgeon from [facility] is always on call at the hospital. [address], [address], [place], [place]  [id number] ? [address]  [address] [place], [place]   [address] [phone] ? [phone] ? FAX [phone] Web site:  [email or web address]

## 2016-06-23 NOTE — Progress Notes (Signed)
Notified Dr. Ossey of BP 

## 2016-06-23 NOTE — Interval H&P Note (Signed)
History and Physical Interval Note: no change in H and P  06/23/2016 7:33 AM  [name]  has presented today for surgery, with the diagnosis of biliary colic  The various methods of treatment have been discussed with the patient and family. After consideration of risks, benefits and other options for treatment, the patient has consented to  Procedure(s): LAPAROSCOPIC CHOLECYSTECTOMY (N/Mosley) as Mosley surgical intervention .  The patient's history has been reviewed, patient examined, no change in status, stable for surgery.  I have reviewed the patient's chart and labs.  Questions were answered to the patient's satisfaction.     [name]

## 2016-06-23 NOTE — Op Note (Signed)
Laparoscopic Cholecystectomy Procedure Note  Indications: This patient presents with symptomatic gallbladder disease and will undergo laparoscopic cholecystectomy.  Pre-operative Diagnosis: biliary colic  Post-operative Diagnosis: Same with chronic cholecystitis  Surgeon: Abigail MiyamotoBLACKMAN,Iaan Oregel A   Assistants: 0  Anesthesia: General endotracheal anesthesia  ASA Class: 2  Procedure Details  The patient was seen again in the Holding Room. The risks, benefits, complications, treatment options, and expected outcomes were discussed with the patient. The possibilities of reaction to medication, pulmonary aspiration, perforation of viscus, bleeding, recurrent infection, finding a normal gallbladder, the need for additional procedures, failure to diagnose a condition, the possible need to convert to an open procedure, and creating a complication requiring transfusion or operation were discussed with the patient. The likelihood of improving the patient's symptoms with return to their baseline status is good.  The patient and/or family concurred with the proposed plan, giving informed consent. The site of surgery properly noted. The patient was taken to Operating Room, identified as [name] and the procedure verified as Laparoscopic Cholecystectomy with Intraoperative Cholangiogram. A Time Out was held and the above information confirmed.  Prior to the induction of general anesthesia, antibiotic prophylaxis was administered. General endotracheal anesthesia was then administered and tolerated well. After the induction, the abdomen was prepped with Chloraprep and draped in sterile fashion. The patient was positioned in the supine position.  Local anesthetic agent was injected into the skin near the umbilicus and an incision made. We dissected down to the abdominal fascia with blunt dissection.  The fascia was incised vertically and we entered the peritoneal cavity bluntly.  A pursestring suture of 0-Vicryl  was placed around the fascial opening.  The Hasson cannula was inserted and secured with the stay suture.  Pneumoperitoneum was then created with CO2 and tolerated well without any adverse changes in the patient's vital signs. A 5-mm port was placed in the subxiphoid position.  Two 5-mm ports were placed in the right upper quadrant. All skin incisions were infiltrated with a local anesthetic agent before making the incision and placing the trocars.   We positioned the patient in reverse Trendelenburg, tilted slightly to the patient's left.  The gallbladder was identified, the fundus grasped and retracted cephalad. It was thick walled in appearance.  Adhesions were lysed bluntly and with the electrocautery where indicated, taking care not to injure any adjacent organs or viscus. The infundibulum was grasped and retracted laterally, exposing the peritoneum overlying the triangle of Calot. This was then divided and exposed in a blunt fashion. The cystic duct was clearly identified and bluntly dissected circumferentially. A critical view of the cystic duct and cystic artery was obtained.  The cystic duct was then ligated with clips and divided. The cystic artery was, dissected free, ligated with clips and divided as well.   The gallbladder was dissected from the liver bed in retrograde fashion with the electrocautery. The gallbladder was removed and placed in an Endocatch sac. The liver bed was irrigated and inspected. Hemostasis was achieved with the electrocautery. Copious irrigation was utilized and was repeatedly aspirated until clear.  The gallbladder and Endocatch sac were then removed through the umbilical port site.  The pursestring suture was used to close the umbilical fascia.    We again inspected the right upper quadrant for hemostasis.  Pneumoperitoneum was released as we removed the trocars.  4-0 Monocryl was used to close the skin.  Skin glue was then applied. The patient was then extubated and  brought to the recovery room  in stable condition. Instrument, sponge, and needle counts were correct at closure and at the conclusion of the case.   Findings: Chronic Cholecystitis without Cholelithiasis  Estimated Blood Loss: Minimal         Drains: 0         Specimens: Gallbladder           Complications: None; patient tolerated the procedure well.         Disposition: PACU - hemodynamically stable.         Condition: stable

## 2016-06-23 NOTE — Anesthesia Postprocedure Evaluation (Signed)
Anesthesia Post Note  Patient: [name]  Procedure(s) Performed: Procedure(s) (LRB): LAPAROSCOPIC CHOLECYSTECTOMY (N/A)  Patient location during evaluation: PACU Anesthesia Type: General Level of consciousness: awake and alert Pain management: pain level controlled Vital Signs Assessment: post-procedure vital signs reviewed and stable Respiratory status: spontaneous breathing, nonlabored ventilation, respiratory function stable and patient connected to nasal cannula oxygen Cardiovascular status: blood pressure returned to baseline and stable Postop Assessment: no signs of nausea or vomiting Anesthetic complications: no    Last Vitals:  Vitals:   06/23/16 1015 06/23/16 1024  BP: (!) 162/98   Pulse: 73 78  Resp: (!) 22 20  Temp:      Last Pain:  Vitals:   06/23/16 1024  TempSrc:   PainSc: 3                  Cecile HearingStephen Edward Turk

## 2016-06-23 NOTE — Anesthesia Procedure Notes (Signed)
Procedure Name: Intubation Date/Time: 06/23/2016 8:36 AM Performed by: Marena ChancyBECKNER, Reilly Blades S Pre-anesthesia Checklist: Patient identified, Emergency Drugs available, Suction available and Patient being monitored Patient Re-evaluated:Patient Re-evaluated prior to inductionOxygen Delivery Method: Circle System Utilized Preoxygenation: Pre-oxygenation with 100% oxygen Intubation Type: IV induction Ventilation: Mask ventilation without difficulty Laryngoscope Size: Miller and 2 Grade View: Grade I Tube type: Oral Tube size: 7.5 mm Number of attempts: 1 Airway Equipment and Method: Stylet and Oral airway Placement Confirmation: ETT inserted through vocal cords under direct vision,  positive ETCO2 and breath sounds checked- equal and bilateral Tube secured with: Tape Dental Injury: Teeth and Oropharynx as per pre-operative assessment

## 2016-06-23 NOTE — Transfer of Care (Signed)
Immediate Anesthesia Transfer of Care Note  Patient: [name]  Procedure(s) Performed: Procedure(s): LAPAROSCOPIC CHOLECYSTECTOMY (N/A)  Patient Location: PACU  Anesthesia Type:General  Level of Consciousness: awake, alert  and oriented  Airway & Oxygen Therapy: Patient Spontanous Breathing and Patient connected to nasal cannula oxygen  Post-op Assessment: Report given to RN, Post -op Vital signs reviewed and stable and Patient moving all extremities X 4  Post vital signs: Reviewed and stable  Last Vitals:  Vitals:   06/23/16 0652 06/23/16 0712  BP: (!) 179/105 (!) 177/100  Pulse: 87   Resp: 18   Temp: 36.9 C     Last Pain:  Vitals:   06/23/16 0652  TempSrc: Oral         Complications: No apparent anesthesia complications

## 2016-06-24 ENCOUNTER — Encounter (HOSPITAL_COMMUNITY): Payer: Self-pay | Admitting: Surgery

## 2016-08-04 ENCOUNTER — Emergency Department (HOSPITAL_COMMUNITY)
Admission: EM | Admit: 2016-08-04 | Discharge: 2016-08-05 | Disposition: A | Discharge status: Home / Self Care | Payer: BLUE CROSS/BLUE SHIELD | Attending: Emergency Medicine | Admitting: Emergency Medicine

## 2016-08-04 DIAGNOSIS — R935 Abnormal findings on diagnostic imaging of other abdominal regions, including retroperitoneum: Secondary | ICD-10-CM | POA: Insufficient documentation

## 2016-08-04 DIAGNOSIS — Z79899 Other long term (current) drug therapy: Secondary | ICD-10-CM | POA: Insufficient documentation

## 2016-08-04 DIAGNOSIS — R112 Nausea with vomiting, unspecified: Secondary | ICD-10-CM | POA: Diagnosis present

## 2016-08-04 DIAGNOSIS — J45909 Unspecified asthma, uncomplicated: Secondary | ICD-10-CM | POA: Insufficient documentation

## 2016-08-04 DIAGNOSIS — Z9049 Acquired absence of other specified parts of digestive tract: Secondary | ICD-10-CM | POA: Insufficient documentation

## 2016-08-04 DIAGNOSIS — R110 Nausea: Secondary | ICD-10-CM

## 2016-08-04 LAB — COMPREHENSIVE METABOLIC PANEL
ALK PHOS: 61 U/L (ref 38–126)
ALT: 17 U/L (ref 14–54)
ANION GAP: 8 (ref 5–15)
AST: 18 U/L (ref 15–41)
Albumin: 4.4 g/dL (ref 3.5–5.0)
BUN: 9 mg/dL (ref 6–20)
CALCIUM: 9.1 mg/dL (ref 8.9–10.3)
CO2: 23 mmol/L (ref 22–32)
CREATININE: 0.74 mg/dL (ref 0.44–1.00)
Chloride: 103 mmol/L (ref 101–111)
Glucose, Bld: 98 mg/dL (ref 65–99)
Potassium: 3.7 mmol/L (ref 3.5–5.1)
Sodium: 134 mmol/L — ABNORMAL LOW (ref 135–145)
Total Bilirubin: 1 mg/dL (ref 0.3–1.2)
Total Protein: 8 g/dL (ref 6.5–8.1)

## 2016-08-04 LAB — CBC
HCT: 42.6 % (ref 36.0–46.0)
Hemoglobin: 14.6 g/dL (ref 12.0–15.0)
MCH: 28.1 pg (ref 26.0–34.0)
MCHC: 34.3 g/dL (ref 30.0–36.0)
MCV: 82.1 fL (ref 78.0–100.0)
PLATELETS: 300 10*3/uL (ref 150–400)
RBC: 5.19 MIL/uL — ABNORMAL HIGH (ref 3.87–5.11)
RDW: 12.8 % (ref 11.5–15.5)
WBC: 11.1 10*3/uL — ABNORMAL HIGH (ref 4.0–10.5)

## 2016-08-04 LAB — I-STAT BETA HCG BLOOD, ED (MC, WL, AP ONLY)

## 2016-08-04 LAB — LIPASE, BLOOD: LIPASE: 25 U/L (ref 11–51)

## 2016-08-04 MED ORDER — DIPHENHYDRAMINE HCL 50 MG/ML IJ SOLN
25.0000 mg | Freq: Once | INTRAMUSCULAR | Status: AC
  Administered 2016-08-05: 25 mg via INTRAVENOUS
  Filled 2016-08-04: qty 1

## 2016-08-04 MED ORDER — METOCLOPRAMIDE HCL 5 MG/ML IJ SOLN
10.0000 mg | Freq: Once | INTRAMUSCULAR | Status: AC
  Administered 2016-08-05: 10 mg via INTRAVENOUS
  Filled 2016-08-04: qty 2

## 2016-08-04 MED ORDER — SODIUM CHLORIDE 0.9 % IJ SOLN
INTRAMUSCULAR | Status: AC
  Filled 2016-08-04: qty 50

## 2016-08-04 MED ORDER — SODIUM CHLORIDE 0.9 % IV BOLUS (SEPSIS): 1000.0000 mL | Freq: Once | INTRAVENOUS | Status: DC

## 2016-08-04 MED ORDER — IOPAMIDOL (ISOVUE-300) INJECTION 61%
INTRAVENOUS | Status: AC
  Administered 2016-08-05: 100 mL via INTRAVENOUS
  Filled 2016-08-04: qty 100

## 2016-08-04 NOTE — ED Notes (Signed)
This RN attempted IV access x1 with no success 

## 2016-08-04 NOTE — ED Triage Notes (Signed)
Pt reports having a cholecystomy on 06/23/16 and "not feeling right since." Pt reports not being able to eat food without N/V. Pt denies fever and abd pain.

## 2016-08-05 ENCOUNTER — Encounter (HOSPITAL_COMMUNITY): Payer: Self-pay

## 2016-08-05 ENCOUNTER — Emergency Department (HOSPITAL_COMMUNITY): Payer: BLUE CROSS/BLUE SHIELD

## 2016-08-05 LAB — URINALYSIS, ROUTINE W REFLEX MICROSCOPIC
BILIRUBIN URINE: NEGATIVE
GLUCOSE, UA: NEGATIVE mg/dL
HGB URINE DIPSTICK: NEGATIVE
Ketones, ur: 5 mg/dL — AB
Nitrite: NEGATIVE
Protein, ur: NEGATIVE mg/dL
SPECIFIC GRAVITY, URINE: 1.04 — AB (ref 1.005–1.030)
pH: 6 (ref 5.0–8.0)

## 2016-08-05 MED ORDER — ONDANSETRON 8 MG PO TBDP: ORAL_TABLET | ORAL | 0 refills | Status: DC

## 2016-08-05 MED ORDER — IOPAMIDOL (ISOVUE-300) INJECTION 61%
INTRAVENOUS | Status: AC
  Filled 2016-08-05: qty 100

## 2016-08-05 MED ORDER — IOPAMIDOL (ISOVUE-300) INJECTION 61%
100.0000 mL | Freq: Once | INTRAVENOUS | Status: AC | PRN
  Administered 2016-08-05: 100 mL via INTRAVENOUS

## 2016-08-05 MED ORDER — SODIUM CHLORIDE 0.9 % IV BOLUS (SEPSIS)
1000.0000 mL | Freq: Once | INTRAVENOUS | Status: AC
  Administered 2016-08-05: 1000 mL via INTRAVENOUS

## 2016-08-05 NOTE — ED Provider Notes (Signed)
[facility]-EMERGENCY DEPT Provider Note   CSN: [id number] Arrival date & time: 08/04/16  2001     History   Chief Complaint Chief Complaint  Patient presents with  . Emesis  . Nausea    HPI [name] is a 30 y.o. female.  HPI 30 year old female with past medical history of cholecystectomy on 10/25 who presents with recurrent nausea and vomiting. The patient states that since her surgery, she's had persistent nausea and vomiting with inability to tolerate by mouth intake. Over the last 2 weeks, her nausea and vomiting have worsened and she has been reportedly unable to tolerate by mouth intake for the last 24 hours. She has tried Zofran with minimal improvement. She denies any associated abdominal pain. Denies any fevers. Denies any dysuria, hematuria, or urinary frequency. No chest pain or shortness of breath.  Past Medical History:  Diagnosis Date  . Arthritis    rt shoulder  . Asthma   . Biliary colic   . Depression   . DJD of shoulder    right  . GERD (gastroesophageal reflux disease)   . Headache   . Hernia of abdominal wall   . Obesity   . Sleep apnea    sleep study 2009, mod sleep apnea, no CPAP  . Wears glasses     Patient Active Problem List   Diagnosis Date Noted  . Impingement syndrome of right shoulder 11/26/2014  . AC (acromioclavicular) arthritis 11/26/2014  . S/P arthroscopy of shoulder 11/26/2014    Past Surgical History:  Procedure Laterality Date  . CHOLECYSTECTOMY N/A 06/23/2016   Procedure: LAPAROSCOPIC CHOLECYSTECTOMY;  Surgeon: Abigail Miyamotoouglas Blackman, MD;  Location: [facility] OR;  Service: General;  Laterality: N/A;  . COLONOSCOPY WITH ESOPHAGOGASTRODUODENOSCOPY (EGD)    . SHOULDER SURGERY     right  . WISDOM TOOTH EXTRACTION      OB History    Gravida Para Term Preterm AB Living   1             SAB TAB Ectopic Multiple Live Births                   Home Medications    Prior to Admission medications   Medication Sig Start Date End Date Taking?  Authorizing Provider  albuterol (PROVENTIL HFA;VENTOLIN HFA) 108 (90 BASE) MCG/ACT inhaler Inhale into the lungs every 6 (six) hours as needed for wheezing or shortness of breath.   Yes Historical Provider, MD  levonorgestrel-ethinyl estradiol (AVIANE,ALESSE,LESSINA) 0.1-20 MG-MCG tablet Take 1 tablet by mouth daily.   Yes Historical Provider, MD  sulfamethoxazole-trimethoprim (BACTRIM DS,SEPTRA DS) 800-160 MG tablet Take 1 tablet by mouth every 12 (twelve) hours. 07/31/16  Yes Historical Provider, MD  HYDROcodone-acetaminophen (NORCO/VICODIN) 5-325 MG tablet Take 1-2 tablets by mouth every 4 (four) hours as needed for moderate pain or severe pain. Patient not taking: Reported on 08/04/2016 06/23/16   Abigail Miyamotoouglas Blackman, MD    Family History Family History  Problem Relation Age of Onset  . Hypertension Mother   . Rheum arthritis Mother   . Rheum arthritis Other   . Congestive Heart Failure Other     Social History Social History  Substance Use Topics  . Smoking status: Never Smoker  . Smokeless tobacco: Never Used  . Alcohol use Yes     Comment: social     Allergies   Azithromycin and Codeine   Review of Systems Review of Systems  Constitutional: Positive for fatigue. Negative for chills and fever.  HENT: Negative  for congestion and rhinorrhea.   Eyes: Negative for visual disturbance.  Respiratory: Negative for cough, shortness of breath and wheezing.   Cardiovascular: Negative for chest pain and leg swelling.  Gastrointestinal: Positive for nausea and vomiting. Negative for abdominal pain and diarrhea.  Genitourinary: Negative for dysuria, flank pain, pelvic pain, vaginal bleeding and vaginal discharge.  Musculoskeletal: Negative for neck pain and neck stiffness.  Skin: Negative for rash and wound.  Allergic/Immunologic: Negative for immunocompromised state.  Neurological: Negative for syncope, weakness and headaches.  All other systems reviewed and are  negative.    Physical Exam Updated Vital Signs BP 163/89 (BP Location: Left Arm)   Pulse 63   Temp 98.3 F (36.8 C) (Oral)   Resp 16   SpO2 98%   Physical Exam  Constitutional: She is oriented to person, place, and time. She appears well-developed and well-nourished. No distress.  HENT:  Head: Normocephalic and atraumatic.  Eyes: Conjunctivae are normal.  Neck: Neck supple.  Cardiovascular: Normal rate, regular rhythm and normal heart sounds.  Exam reveals no friction rub.   No murmur heard. Pulmonary/Chest: Effort normal and breath sounds normal. No respiratory distress. She has no wheezes. She has no rales.  Abdominal: Soft. Bowel sounds are normal. She exhibits no distension. There is tenderness (Moderate, epigastric and periumbilical). There is no rebound and no guarding.  Musculoskeletal: She exhibits no edema.  Neurological: She is alert and oriented to person, place, and time. She exhibits normal muscle tone.  Skin: Skin is warm. Capillary refill takes less than 2 seconds.  Psychiatric: She has a normal mood and affect.  Nursing note and vitals reviewed.    ED Treatments / Results  Labs (all labs ordered are listed, but only abnormal results are displayed) Labs Reviewed  COMPREHENSIVE METABOLIC PANEL - Abnormal; Notable for the following:       Result Value   Sodium 134 (*)    All other components within normal limits  CBC - Abnormal; Notable for the following:    WBC 11.1 (*)    RBC 5.19 (*)    All other components within normal limits  LIPASE, BLOOD  URINALYSIS, ROUTINE W REFLEX MICROSCOPIC  I-STAT BETA HCG BLOOD, ED ([facility], [facility], [facility] ONLY)    EKG  EKG Interpretation None       Radiology No results found.  Procedures Procedures (including critical care time)  Medications Ordered in ED Medications  metoCLOPramide (REGLAN) injection 10 mg (not administered)  diphenhydrAMINE (BENADRYL) injection 25 mg (not administered)  sodium chloride 0.9 % injection  (not administered)  iopamidol (ISOVUE-300) 61 % injection (not administered)  sodium chloride 0.9 % bolus 1,000 mL (not administered)  iopamidol (ISOVUE-300) 61 % injection 100 mL (100 mLs Intravenous Contrast Given 08/05/16 0125)     Initial Impression / Assessment and Plan / ED Course  I have reviewed the triage vital signs and the nursing notes.  Pertinent labs & imaging results that were available during my care of the patient were reviewed by me and considered in my medical decision making (see chart for details).  Clinical Course     30 year old female with history of laparoscopic cholecystectomy in October who presents with recurrent nausea and vomiting. On arrival, vital signs are stable and within normal limits. Examination is as above. Labwork shows mild leukocytosis. CMP unremarkable. Given significant tenderness on exam with history of recent surgery, will obtain CT. IVF, reglan given. Plan to f/u CT. If negative, suspect mild post-cholecystectomy nausea versus gastritis versus  GERD versus viral GI illness. She may need outpt GI referral for further work-up, with consideration of bentyl for spasm component. Will f/u CT, re-assess. Pt in agreement. Denies pain at this time. No vaginal bleeding, discharge, or evidence to suggest GU etiology.  Final Clinical Impressions(s) / ED Diagnoses   Final diagnoses:  Non-pregnancy nausea and vomiting  Status post cholecystectomy       Shaune Pollack, MD 08/05/16 0145

## 2016-08-05 NOTE — ED Notes (Signed)
Pt ambulatory to restroom

## 2016-08-05 NOTE — ED Notes (Signed)
IV team at bedside 

## 2017-02-16 ENCOUNTER — Emergency Department (HOSPITAL_COMMUNITY)
Admission: EM | Admit: 2017-02-16 | Discharge: 2017-02-16 | Disposition: A | Discharge status: Home / Self Care | Payer: BLUE CROSS/BLUE SHIELD | Attending: Emergency Medicine | Admitting: Emergency Medicine

## 2017-02-16 ENCOUNTER — Encounter (HOSPITAL_COMMUNITY): Payer: Self-pay | Admitting: Emergency Medicine

## 2017-02-16 DIAGNOSIS — J45909 Unspecified asthma, uncomplicated: Secondary | ICD-10-CM | POA: Diagnosis not present

## 2017-02-16 DIAGNOSIS — Z79899 Other long term (current) drug therapy: Secondary | ICD-10-CM | POA: Diagnosis not present

## 2017-02-16 DIAGNOSIS — R51 Headache: Secondary | ICD-10-CM | POA: Diagnosis present

## 2017-02-16 DIAGNOSIS — R42 Dizziness and giddiness: Secondary | ICD-10-CM | POA: Diagnosis not present

## 2017-02-16 DIAGNOSIS — R519 Headache, unspecified: Secondary | ICD-10-CM

## 2017-02-16 LAB — BASIC METABOLIC PANEL
ANION GAP: 8 (ref 5–15)
BUN: 10 mg/dL (ref 6–20)
CALCIUM: 9 mg/dL (ref 8.9–10.3)
CO2: 16 mmol/L — AB (ref 22–32)
CREATININE: 0.77 mg/dL (ref 0.44–1.00)
Chloride: 112 mmol/L — ABNORMAL HIGH (ref 101–111)
GFR calc Af Amer: >60 mL/min (ref >60)
GLUCOSE: 120 mg/dL — AB (ref 65–99)
Potassium: 3.7 mmol/L (ref 3.5–5.1)
Sodium: 136 mmol/L (ref 135–145)

## 2017-02-16 LAB — CBC
HCT: 41.6 % (ref 36.0–46.0)
HEMOGLOBIN: 14.7 g/dL (ref 12.0–15.0)
MCH: 28.6 pg (ref 26.0–34.0)
MCHC: 35.3 g/dL (ref 30.0–36.0)
MCV: 80.9 fL (ref 78.0–100.0)
Platelets: 272 10*3/uL (ref 150–400)
RBC: 5.14 MIL/uL — ABNORMAL HIGH (ref 3.87–5.11)
RDW: 12.6 % (ref 11.5–15.5)
WBC: 8.3 10*3/uL (ref 4.0–10.5)

## 2017-02-16 LAB — I-STAT BETA HCG BLOOD, ED (MC, WL, AP ONLY)

## 2017-02-16 MED ORDER — MECLIZINE HCL 12.5 MG PO TABS: 25.0000 mg | ORAL_TABLET | Freq: Three times a day (TID) | ORAL | 0 refills | Status: DC | PRN

## 2017-02-16 MED ORDER — SUMATRIPTAN SUCCINATE 6 MG/0.5ML ~~LOC~~ SOLN
6.0000 mg | Freq: Once | SUBCUTANEOUS | Status: AC
  Administered 2017-02-16: 6 mg via SUBCUTANEOUS
  Filled 2017-02-16: qty 0.5

## 2017-02-16 NOTE — ED Notes (Signed)
Pt reports she has had a headache almost every day for the past 2 months reporting pain behind the eyes and across the forehead. 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 She does wear glass and denies have vision exam recently, last exam about one year ago. [facility] [address] for the same and is taking migraine medication. She reports they have preformed CT scan and she is due to have a MRI.

## 2017-02-16 NOTE — ED Triage Notes (Signed)
Pt reports 2 months of consistent headaches. Pt is taking prescribed medications without relief. Saw PCP on Monday and is going to be scheduled for a MRI.  PT states she cannot take the pain anymore. Pt also reports dizziness that comes and goes, mostly when she is hot.

## 2017-02-16 NOTE — ED Provider Notes (Signed)
[facility]-EMERGENCY DEPT Provider Note   CSN: [id number] Arrival date & time: 02/16/17  1218     History   Chief Complaint Chief Complaint  Patient presents with  . Headache  . Dizziness    HPI [name] is a 31 y.o. female who presents chronic daily HA for the last two months. She reports that she is followed by neurology at [facility] and diagnosed with migraines. She reports her HAs last anywhere from a few hours to all day, and she complains of retro-orbital pain and bilateral forehead. She denies a change in the character of her HA. She was started on Topamax and  imitrex at home. She reports that she presented to the ED today because she has develop intermittent tingling in her bilateral hands and feet over the last week and new intermittent dizziness that is aggravated by heat and alleviated by nothing. She states that she has a close friend who was recently diagnosed with MS, and she is concerned that she may have MS because her friend's symptoms started out very similarly. She reports the dizziness will last from a few minutes to a couple of hours before resolving. No h/o of similar. She reports that she is concerned because her card does not have AC, and she has been getting dizzy recently while driving. She denies aggravation with positional changes. She denies fever, chills, amaurosis fugax, tinnitus, recent falls, diplopia, emesis, or syncope. She denies treatment PTA for dizziness. LMP 01/16/17.   Allergies include toradol. 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 She wears glasses; last opthalmology visit ~1 year ago. No h/o of sinusitis. No family h/o of MS. She reports that she recently had a head CT performed, which she was told was normal and her neurologist wants her to have a brain MRI soon, but it has not been scheduled yet.   The history is provided by the patient. No language interpreter was used.    Past Medical History:    Diagnosis Date  . Arthritis    rt shoulder  . Asthma   . Biliary colic   . Depression   . DJD of shoulder    right  . GERD (gastroesophageal reflux disease)   . Headache   . Hernia of abdominal wall   . Obesity   . Sleep apnea    sleep study 2009, mod sleep apnea, no CPAP  . Wears glasses     Patient Active Problem List   Diagnosis Date Noted  . Impingement syndrome of right shoulder 11/26/2014  . AC (acromioclavicular) arthritis 11/26/2014  . S/P arthroscopy of shoulder 11/26/2014    Past Surgical History:  Procedure Laterality Date  . CHOLECYSTECTOMY N/A 06/23/2016   Procedure: LAPAROSCOPIC CHOLECYSTECTOMY;  Surgeon: Abigail Miyamotoouglas Blackman, MD;  Location: [facility] OR;  Service: General;  Laterality: N/A;  . COLONOSCOPY WITH ESOPHAGOGASTRODUODENOSCOPY (EGD)    . SHOULDER SURGERY     right  . WISDOM TOOTH EXTRACTION      OB History    Gravida Para Term Preterm AB Living   1             SAB TAB Ectopic Multiple Live Births                   Home Medications    Prior to Admission medications   Medication Sig Start Date End Date Taking? Authorizing Provider  albuterol (PROVENTIL HFA;VENTOLIN HFA) 108 (90 BASE) MCG/ACT inhaler Inhale  into the lungs every 6 (six) hours as needed for wheezing or shortness of breath.   Yes [provider]  levonorgestrel-ethinyl estradiol (AVIANE,ALESSE,LESSINA) 0.1-20 MG-MCG tablet Take 1 tablet by mouth daily.   Yes [provider]  ondansetron (ZOFRAN ODT) 8 MG disintegrating tablet 8mg  ODT q8 hours prn nausea 08/05/16  Yes Palumbo, April, MD  SUMAtriptan (IMITREX) 100 MG tablet Take 100 mg by mouth daily as needed for migraine. 02/03/17  Yes [provider]  topiramate (TOPAMAX) 100 MG tablet Take 100 mg by mouth 2 (two) times daily. 02/02/17  Yes [provider]  meclizine (ANTIVERT) 12.5 MG tablet Take 2 tablets (25 mg total) by mouth 3 (three) times daily as needed for dizziness. 02/16/17   McDonald, Mia A, PA-C     Family History Family History  Problem Relation Age of Onset  . Hypertension Mother   . Rheum arthritis Mother   . Rheum arthritis Other   . Congestive Heart Failure Other     Social History Social History  Substance Use Topics  . Smoking status: Never Smoker  . Smokeless tobacco: Never Used  . Alcohol use Yes     Comment: social     Allergies   Azithromycin; Codeine; and Toradol [ketorolac tromethamine]   Review of Systems Review of Systems  Constitutional: Negative for activity change, chills and fever.  HENT: Negative for ear pain, sinus pain and tinnitus.   Eyes: Positive for visual disturbance. Negative for pain.  Respiratory: Negative for shortness of breath.   Cardiovascular: Negative for chest pain.  Gastrointestinal: Positive for nausea. Negative for abdominal pain and vomiting.  Musculoskeletal: Negative for back pain, neck pain and neck stiffness.  Skin: Negative for rash.  Neurological: Positive for dizziness, numbness (tingling) and headaches. Negative for tremors, seizures, syncope, facial asymmetry, speech difficulty, weakness and light-headedness.     Physical Exam Updated Vital Signs BP (!) 149/89   Pulse 99   Temp 97.8 F (36.6 C) (Oral)   Resp 18   Ht 5\' 3"  (1.6 m)   Wt 120.7 kg (266 lb)   LMP 01/16/2017   SpO2 100%   BMI 47.12 kg/m   Physical Exam  Constitutional: No distress.  HENT:  Head: Normocephalic.  Right Ear: Tympanic membrane and external ear normal.  Left Ear: Tympanic membrane and external ear normal.  Nose: Nose normal. No rhinorrhea or sinus tenderness. Right sinus exhibits no maxillary sinus tenderness and no frontal sinus tenderness. Left sinus exhibits no maxillary sinus tenderness and no frontal sinus tenderness.  Mouth/Throat: Uvula is midline and oropharynx is clear and moist.  Eyes: Conjunctivae and EOM are normal. Pupils are equal, round, and reactive to light. Right conjunctiva is not injected. Right  conjunctiva has no hemorrhage. Left conjunctiva is not injected. Left conjunctiva has no hemorrhage. Right eye exhibits normal extraocular motion and no nystagmus. Left eye exhibits normal extraocular motion and no nystagmus.  Fundoscopic exam:      The right eye shows no AV nicking, no hemorrhage and no papilledema.       The left eye shows no AV nicking, no hemorrhage and no papilledema.  Neck: Neck supple.  Cardiovascular: Normal rate, regular rhythm, normal heart sounds and intact distal pulses.  Exam reveals no gallop and no friction rub.   No murmur heard. Pulmonary/Chest: Effort normal and breath sounds normal. No respiratory distress. She has no wheezes. She has no rales.  Abdominal: Soft. Bowel sounds are normal. She exhibits no distension. There is  no tenderness.  Musculoskeletal: Normal range of motion.  Neurological: She is alert.  Cranial nerves 2-12 intact. Finger-to-nose is normal. 5/5 motor strength of the bilateral upper and lower extremities. Moves all four extremities. Negative Romberg. Ambulatory without difficulty. NVI.    Skin: Skin is warm. No rash noted.  Psychiatric: Her behavior is normal.  Nursing note and vitals reviewed.    ED Treatments / Results  Labs (all labs ordered are listed, but only abnormal results are displayed) Labs Reviewed  BASIC METABOLIC PANEL - Abnormal; Notable for the following:       Result Value   Chloride 112 (*)    CO2 16 (*)    Glucose, Bld 120 (*)    All other components within normal limits  CBC - Abnormal; Notable for the following:    RBC 5.14 (*)    All other components within normal limits  I-STAT BETA HCG BLOOD, ED ([facility], [facility], [facility] ONLY)    EKG  EKG Interpretation None       Radiology No results found.  Procedures Procedures (including critical care time)  Medications Ordered in ED Medications  SUMAtriptan (IMITREX) injection 6 mg (6 mg Subcutaneous Given 02/16/17 1601)     Initial Impression / Assessment and  Plan / ED Course  I have reviewed the triage vital signs and the nursing notes.  Pertinent labs & imaging results that were available during my care of the patient were reviewed by me and considered in my medical decision making (see chart for details).     Patient with chronic daily headaches followed by neurology who presents for new intermittent dizziness, tingling with stocking glove distribution, and concern that her friend was recently diagnosed with MS with similar symptoms. Labs are at patient's baseline and unremarkable. No focal deficits on neuro exam. Fundoscopic exam unremarkable. No correlation with menstrual periods, unlikely pseudotumor cerebri. Patient recently started on topamax (<2 weeks ago), following which she developed tingling in her extremities, which is one of the most common side effects of this medication. No nystagmus noted. No tinnitus associated with dizziness. This could be related to the new medications given onset. Patient only reports onset of symptoms when she gets hot, which happens to be in her car since she does not have AC. This may be more motion sickness versus vertigo. Unlikely BPPV given history. Will give the patient a short course of meclizine to see if dizziness improves. Patient appears reliable and given recent head CT and exam, I do not feel the need for further imaging today. Injection of sumatriptan given today, which patient reports relief of current HA. Recommended d/cing Topamax to see if tingling improves. Will provider a referral to OP neurology with [facility] if she would like to keep all of her records in network. Encouraged neurology follow-up to schedule MRI and discuss her medications. VSS. NAD. The patient is agreeable to the plan and is stable for discharge at this time.   Final Clinical Impressions(s) / ED Diagnoses   Final diagnoses:  Headache, unspecified headache type  Dizziness    New Prescriptions Discharge Medication List as of  02/16/2017  4:06 PM    START taking these medications   Details  meclizine (ANTIVERT) 12.5 MG tablet Take 2 tablets (25 mg total) by mouth 3 (three) times daily as needed for dizziness., Starting Wed 02/16/2017, Print         McDonald, Mia A, PA-C 02/18/17 1336    Mancel Bale, MD 02/18/17 [phone]

## 2017-02-16 NOTE — ED Notes (Signed)
ED Provider at bedside. 

## 2017-02-16 NOTE — ED Notes (Signed)
Pt stable, understands discharge instructions, and reasons for return.   

## 2017-02-16 NOTE — Discharge Instructions (Signed)
You can call [facility] to schedule a follow-up appointment. If you develop new or worsening symptoms, including visual changes, a worsening or new headache, or worsening numbness, tingling, or weakness, please return to the emergency department for reevaluation. Consider discontinuing the Topamax to see if the numbness and tingling and your hands and feet resolved. Please call your ophthalmologist to schedule a one year checkup of his vision.

## 2017-02-18 ENCOUNTER — Other Ambulatory Visit: Payer: Self-pay | Admitting: Physician Assistant

## 2017-02-18 DIAGNOSIS — G43909 Migraine, unspecified, not intractable, without status migrainosus: Secondary | ICD-10-CM

## 2017-03-05 ENCOUNTER — Ambulatory Visit
Admission: RE | Admit: 2017-03-05 | Discharge: 2017-03-05 | Disposition: A | Discharge status: Home / Self Care | Payer: 59 | Source: Ambulatory Visit | Attending: Physician Assistant | Admitting: Physician Assistant

## 2017-03-05 DIAGNOSIS — G43909 Migraine, unspecified, not intractable, without status migrainosus: Secondary | ICD-10-CM

## 2017-03-09 ENCOUNTER — Emergency Department (HOSPITAL_COMMUNITY)
Admission: EM | Admit: 2017-03-09 | Discharge: 2017-03-09 | Disposition: A | Discharge status: Home / Self Care | Payer: 59 | Attending: Emergency Medicine | Admitting: Emergency Medicine

## 2017-03-09 ENCOUNTER — Encounter (HOSPITAL_COMMUNITY): Payer: Self-pay | Admitting: Emergency Medicine

## 2017-03-09 DIAGNOSIS — Z79899 Other long term (current) drug therapy: Secondary | ICD-10-CM | POA: Diagnosis not present

## 2017-03-09 DIAGNOSIS — R51 Headache: Secondary | ICD-10-CM | POA: Diagnosis present

## 2017-03-09 DIAGNOSIS — G43001 Migraine without aura, not intractable, with status migrainosus: Secondary | ICD-10-CM | POA: Diagnosis not present

## 2017-03-09 DIAGNOSIS — J45909 Unspecified asthma, uncomplicated: Secondary | ICD-10-CM | POA: Diagnosis not present

## 2017-03-09 MED ORDER — ONDANSETRON 4 MG PO TBDP: 4.0000 mg | ORAL_TABLET | Freq: Three times a day (TID) | ORAL | 0 refills | Status: DC | PRN

## 2017-03-09 MED ORDER — SUMATRIPTAN SUCCINATE 6 MG/0.5ML ~~LOC~~ SOLN
6.0000 mg | Freq: Once | SUBCUTANEOUS | Status: AC
  Administered 2017-03-09: 6 mg via SUBCUTANEOUS
  Filled 2017-03-09: qty 0.5

## 2017-03-09 MED ORDER — RIZATRIPTAN BENZOATE 10 MG PO TABS: 10.0000 mg | ORAL_TABLET | ORAL | 0 refills | Status: DC | PRN

## 2017-03-09 MED ORDER — ONDANSETRON 4 MG PO TBDP
4.0000 mg | ORAL_TABLET | Freq: Once | ORAL | Status: AC
Start: 2017-03-09 — End: 2017-03-09
  Administered 2017-03-09: 4 mg via ORAL
  Filled 2017-03-09: qty 1

## 2017-03-09 NOTE — ED Provider Notes (Signed)
[facility]-EMERGENCY DEPT Provider Note   CSN: [id number] Arrival date & time: 03/09/17  [id number]     History   Chief Complaint Chief Complaint  Patient presents with  . Migraine    HPI [name] is a 31 y.o. female who presents to the emergency department with a HA x9 days with associated numbness in her bilateral hands and feet and black spots in her vision. She reports that the headache has been intermittent and will last anywhere from 15 minutes to an hour and then spontaneously resolved for a short amount of time before returning with the same quality and severity. She reports the numbness in her bilateral hands and feet is worsening and she has had difficulty opening jars and is somewhat off balance from the She is followed by a neurologist at [facility] and is on Topamax, nortriptyline, Percocet, promethazine, meclizine, and Sumatriptan. She reports she is out of her Sumatriptan because the pharmacy said her refill will cost $160, which she states she cannot afford. She reports she had an MRI of her brain performed within the last week. She is allergic to Toradol. She reports she was evaluated by ophthalmology within the last week and had normal IOP. She was also seen by OBGYN who d/cing her PO BC for a few weeks to see if her symptoms improved.  The history is provided by the patient. No language interpreter was used.    Past Medical History:  Diagnosis Date  . Arthritis    rt shoulder  . Asthma   . Biliary colic   . Depression   . DJD of shoulder    right  . GERD (gastroesophageal reflux disease)   . Headache   . Hernia of abdominal wall   . Obesity   . Sleep apnea    sleep study 2009, mod sleep apnea, no CPAP  . Wears glasses     Patient Active Problem List   Diagnosis Date Noted  . Impingement syndrome of right shoulder 11/26/2014  . AC (acromioclavicular) arthritis 11/26/2014  . S/P arthroscopy of shoulder 11/26/2014    Past Surgical History:  Procedure  Laterality Date  . CHOLECYSTECTOMY N/A 06/23/2016   Procedure: LAPAROSCOPIC CHOLECYSTECTOMY;  Surgeon: Abigail Miyamotoouglas Blackman, MD;  Location: [facility] OR;  Service: General;  Laterality: N/A;  . COLONOSCOPY WITH ESOPHAGOGASTRODUODENOSCOPY (EGD)    . SHOULDER SURGERY     right  . WISDOM TOOTH EXTRACTION      OB History    Gravida Para Term Preterm AB Living   1             SAB TAB Ectopic Multiple Live Births                   Home Medications    Prior to Admission medications   Medication Sig Start Date End Date Taking? Authorizing Provider  albuterol (PROVENTIL HFA;VENTOLIN HFA) 108 (90 BASE) MCG/ACT inhaler Inhale into the lungs every 6 (six) hours as needed for wheezing or shortness of breath.    [provider]  levonorgestrel-ethinyl estradiol (AVIANE,ALESSE,LESSINA) 0.1-20 MG-MCG tablet Take 1 tablet by mouth daily.    [provider]  meclizine (ANTIVERT) 12.5 MG tablet Take 2 tablets (25 mg total) by mouth 3 (three) times daily as needed for dizziness. 02/16/17   Kadyn Chovan A, PA-C  ondansetron (ZOFRAN ODT) 4 MG disintegrating tablet Take 1 tablet (4 mg total) by mouth every 8 (eight) hours as needed for nausea or vomiting. 03/09/17   Georgia Baria,  Takari Duncombe A, PA-C  rizatriptan (MAXALT) 10 MG tablet Take 1 tablet (10 mg total) by mouth as needed for migraine. May repeat in 2 hours if needed 03/09/17   Laquan Ludden A, PA-C  SUMAtriptan (IMITREX) 100 MG tablet Take 100 mg by mouth daily as needed for migraine. 02/03/17   [provider]  topiramate (TOPAMAX) 100 MG tablet Take 100 mg by mouth 2 (two) times daily. 02/02/17   [provider]    Family History Family History  Problem Relation Age of Onset  . Hypertension Mother   . Rheum arthritis Mother   . Rheum arthritis Other   . Congestive Heart Failure Other     Social History Social History  Substance Use Topics  . Smoking status: Never Smoker  . Smokeless tobacco: Never Used  . Alcohol use Yes      Comment: social   Allergies   Azithromycin; Codeine; and Toradol [ketorolac tromethamine]   Review of Systems Review of Systems  Constitutional: Negative for chills and fever.  HENT: Negative for ear discharge, ear pain and tinnitus.   Eyes: Positive for visual disturbance (black spots in vision).  Respiratory: Negative for shortness of breath.   Cardiovascular: Negative for chest pain.  Gastrointestinal: Positive for nausea. Negative for abdominal pain, diarrhea and vomiting.  Genitourinary: Negative for dysuria.  Musculoskeletal: Negative for back pain, neck pain and neck stiffness.  Skin: Negative for rash.  Neurological: Positive for dizziness, weakness and numbness.     Physical Exam Updated Vital Signs BP 114/67   Pulse 78   Temp 98 F (36.7 C) (Oral)   Resp 20   SpO2 100%   Physical Exam  Constitutional: She is oriented to person, place, and time. No distress.  Obese female.  HENT:  Head: Normocephalic and atraumatic.  Left Ear: External ear normal.  Nose: Nose normal.  Mouth/Throat: Oropharynx is clear and moist.  Verrucous mass in the right canal.   Eyes: Conjunctivae and EOM are normal. Pupils are equal, round, and reactive to light. No scleral icterus.  Neck: Normal range of motion. Neck supple.  Full range of motion of the neck with flexion, extension, lateral flexion, and rotation. No meningeal signs.  Cardiovascular: Normal rate, regular rhythm, normal heart sounds and intact distal pulses.  Exam reveals no gallop and no friction rub.   No murmur heard. Pulmonary/Chest: Effort normal and breath sounds normal. No respiratory distress. She has no wheezes. She has no rales.  Abdominal: Soft. She exhibits no distension.  Musculoskeletal: Normal range of motion. She exhibits no edema, tenderness or deformity.  Nontender to palpation over the cervical, thoracic, and lumbar spine without crepitus or step-offs. No surrounding paraspinal muscle tenderness.    Lymphadenopathy:    She has no cervical adenopathy.  Neurological: She is alert and oriented to person, place, and time.  Cranial nerves 2-12 intact. Finger-to-nose is normal. 5/5 motor strength of the bilateral upper and lower extremities. Moves all four extremities. Negative Romberg. Ambulatory without difficulty. NVI.   Sensation is intact, but soft and sharp sensation is mildly decreased over the dorsum of the lateral aspect of the bilateral feet.    Skin: Skin is warm. Capillary refill takes less than 2 seconds. No rash noted. No erythema.  Psychiatric: Her behavior is normal.  Nursing note and vitals reviewed.   ED Treatments / Results  Labs (all labs ordered are listed, but only abnormal results are displayed) Labs Reviewed - No data to display  EKG  EKG  Interpretation None       Radiology No results found.  Procedures Procedures (including critical care time)  Medications Ordered in ED Medications  SUMAtriptan (IMITREX) injection 6 mg (6 mg Subcutaneous Given 03/09/17 1146)  ondansetron (ZOFRAN-ODT) disintegrating tablet 4 mg (4 mg Oral Given 03/09/17 1146)     Initial Impression / Assessment and Plan / ED Course  I have reviewed the triage vital signs and the nursing notes.  Pertinent labs & imaging results that were available during my care of the patient were reviewed by me and considered in my medical decision making (see chart for details).     Pt HA treated and improved while in ED with Sumatriptan.  Presentation is like pt's typical HA and not concerning for [facility], ICH, Meningitis, or temporal arteritis. Reviewed recent MRI imaging and ophthalmology notes. Pt is afebrile with no focal neuro deficits, nuchal rigidity, or change in vision. Pt is to follow up with neurology for continued workup and treatment and prescription for Rizatriptan given. Pt verbalizes understanding and is agreeable with plan to dc.   Final Clinical Impressions(s) / ED Diagnoses    Final diagnoses:  Migraine without aura and with status migrainosus, not intractable    New Prescriptions Discharge Medication List as of 03/09/2017 12:53 PM    START taking these medications   Details  rizatriptan (MAXALT) 10 MG tablet Take 1 tablet (10 mg total) by mouth as needed for migraine. May repeat in 2 hours if needed, Starting Wed 03/09/2017, Print         Camilah Spillman A, PA-C 03/10/17 0008    Geoffery Lyons, MD 03/11/17 2359

## 2017-03-09 NOTE — Discharge Instructions (Signed)
If you develop any new or worsening symptoms including fever, severe weakness, numbness, or tingling, or the inability to control your bladder or bowels, please return to the emergency department for reevaluation. Please continue to follow-up with your neurologist. Please do not take sumatriptan with the Rizatriptan. You may take one tablet of the Rizatriptan for migraines and repeat a second tablet in 2 hours if needed. Please do not take more than 3 tablets in a 24-hour period.

## 2017-03-09 NOTE — ED Triage Notes (Signed)
States has had a h/a x 9 days dx with migrains 2 months ago and she has taken hermeds but they have not helped has no money to go back to her PCP

## 2017-03-09 NOTE — ED Notes (Signed)
ED Provider at bedside. 

## 2017-03-09 NOTE — ED Notes (Signed)
Got patient into a gown 

## 2017-03-15 ENCOUNTER — Emergency Department (HOSPITAL_COMMUNITY)
Admission: EM | Admit: 2017-03-15 | Discharge: 2017-03-15 | Disposition: A | Discharge status: Home / Self Care | Payer: 59 | Attending: Emergency Medicine | Admitting: Emergency Medicine

## 2017-03-15 ENCOUNTER — Encounter (HOSPITAL_COMMUNITY): Payer: Self-pay

## 2017-03-15 DIAGNOSIS — F329 Major depressive disorder, single episode, unspecified: Secondary | ICD-10-CM | POA: Diagnosis not present

## 2017-03-15 DIAGNOSIS — Z79899 Other long term (current) drug therapy: Secondary | ICD-10-CM | POA: Diagnosis not present

## 2017-03-15 DIAGNOSIS — G43809 Other migraine, not intractable, without status migrainosus: Secondary | ICD-10-CM

## 2017-03-15 DIAGNOSIS — Z9049 Acquired absence of other specified parts of digestive tract: Secondary | ICD-10-CM | POA: Insufficient documentation

## 2017-03-15 DIAGNOSIS — J45909 Unspecified asthma, uncomplicated: Secondary | ICD-10-CM | POA: Insufficient documentation

## 2017-03-15 DIAGNOSIS — G43909 Migraine, unspecified, not intractable, without status migrainosus: Secondary | ICD-10-CM | POA: Insufficient documentation

## 2017-03-15 DIAGNOSIS — R51 Headache: Secondary | ICD-10-CM | POA: Diagnosis present

## 2017-03-15 LAB — POC URINE PREG, ED: Preg Test, Ur: NEGATIVE

## 2017-03-15 MED ORDER — SUMATRIPTAN SUCCINATE 6 MG/0.5ML ~~LOC~~ SOLN
6.0000 mg | Freq: Once | SUBCUTANEOUS | Status: AC
  Administered 2017-03-15: 6 mg via SUBCUTANEOUS
  Filled 2017-03-15: qty 0.5

## 2017-03-15 NOTE — ED Notes (Signed)
Pt oob to br with steady gait 

## 2017-03-15 NOTE — ED Notes (Signed)
Pt states she understands instructions. Home stable with steady gait. 

## 2017-03-15 NOTE — ED Triage Notes (Signed)
Per Pt, Pt is coming from home with complaints of migraine that started on Sunday night. Hx of the same. Reports black spots yesterday with driving. Denies N/V.

## 2017-03-16 NOTE — ED Provider Notes (Signed)
[facility] Provider Note   CSN: [id number] Arrival date & time: 03/15/17  [id number]     History   Chief Complaint Chief Complaint  Patient presents with  . Migraine    HPI [name] is a 31 y.o. female.  HPI   31yo female presents with concern for headache. Has had multiple presentations recently to ED for same. She has had ophthalmology evaluation and evaluation with neurology. MR 7/7 for similar symptoms showed no acute abnormalities.  Reports she normally improves with sumatriptan injection in the ED. HA started Sunday. Yesterday had associated spots in the bilateral vision. No double vision, no visual field deficits. Has experienced similar symptoms in the past prior to an during migraines.  Visual symptoms resolved however today having continued headache.  Headache throbbing, frontal and bitemporal.  Worsens with bright lights and sounds.  No n/v with this episode. No other numbness/weakness.  Past Medical History:  Diagnosis Date  . Arthritis    rt shoulder  . Asthma   . Biliary colic   . Depression   . DJD of shoulder    right  . GERD (gastroesophageal reflux disease)   . Headache   . Hernia of abdominal wall   . Obesity   . Sleep apnea    sleep study 2009, mod sleep apnea, no CPAP  . Wears glasses     Patient Active Problem List   Diagnosis Date Noted  . Impingement syndrome of right shoulder 11/26/2014  . AC (acromioclavicular) arthritis 11/26/2014  . S/P arthroscopy of shoulder 11/26/2014    Past Surgical History:  Procedure Laterality Date  . CHOLECYSTECTOMY N/A 06/23/2016   Procedure: LAPAROSCOPIC CHOLECYSTECTOMY;  Surgeon: Abigail Miyamoto, MD;  Location: [facility] OR;  Service: General;  Laterality: N/A;  . COLONOSCOPY WITH ESOPHAGOGASTRODUODENOSCOPY (EGD)    . SHOULDER SURGERY     right  . WISDOM TOOTH EXTRACTION      OB History    Gravida Para Term Preterm AB Living   1             SAB TAB Ectopic Multiple Live Births                    Home Medications    Prior to Admission medications   Medication Sig Start Date End Date Taking? Authorizing Provider  albuterol (PROVENTIL HFA;VENTOLIN HFA) 108 (90 BASE) MCG/ACT inhaler Inhale into the lungs every 6 (six) hours as needed for wheezing or shortness of breath.    [provider]  levonorgestrel-ethinyl estradiol (AVIANE,ALESSE,LESSINA) 0.1-20 MG-MCG tablet Take 1 tablet by mouth daily.    [provider]  meclizine (ANTIVERT) 12.5 MG tablet Take 2 tablets (25 mg total) by mouth 3 (three) times daily as needed for dizziness. 02/16/17   McDonald, Mia A, PA-C  ondansetron (ZOFRAN ODT) 4 MG disintegrating tablet Take 1 tablet (4 mg total) by mouth every 8 (eight) hours as needed for nausea or vomiting. 03/09/17   McDonald, Mia A, PA-C  rizatriptan (MAXALT) 10 MG tablet Take 1 tablet (10 mg total) by mouth as needed for migraine. May repeat in 2 hours if needed 03/09/17   McDonald, Mia A, PA-C  SUMAtriptan (IMITREX) 100 MG tablet Take 100 mg by mouth daily as needed for migraine. 02/03/17   [provider]  topiramate (TOPAMAX) 100 MG tablet Take 100 mg by mouth 2 (two) times daily. 02/02/17   [provider]    Family History Family History  Problem Relation Age of  Onset  . Hypertension Mother   . Rheum arthritis Mother   . Rheum arthritis Other   . Congestive Heart Failure Other     Social History Social History  Substance Use Topics  . Smoking status: Never Smoker  . Smokeless tobacco: Never Used  . Alcohol use Yes     Comment: social     Allergies   Azithromycin; Codeine; and Toradol [ketorolac tromethamine]   Review of Systems Review of Systems  Constitutional: Negative for fever.  HENT: Negative for sore throat.   Eyes: Negative for visual disturbance (yesetrday now resolved).  Respiratory: Negative for cough and shortness of breath.   Cardiovascular: Negative for chest pain.  Gastrointestinal: Negative for abdominal  pain, nausea and vomiting.  Genitourinary: Negative for difficulty urinating.  Musculoskeletal: Negative for back pain and neck pain.  Skin: Negative for rash.  Neurological: Positive for headaches. Negative for dizziness, syncope, facial asymmetry, speech difficulty, weakness and numbness.     Physical Exam Updated Vital Signs BP 123/82 (BP Location: Right Arm)   Pulse 94   Temp 98.7 F (37.1 C) (Oral)   Resp 16   Ht 5\' 3"  (1.6 m)   Wt 118.8 kg (262 lb)   SpO2 100%   BMI 46.41 kg/m   Physical Exam  Constitutional: She is oriented to person, place, and time. She appears well-developed and well-nourished. No distress.  HENT:  Head: Normocephalic and atraumatic.  Eyes: Conjunctivae and EOM are normal.  Neck: Normal range of motion.  Cardiovascular: Normal rate, regular rhythm, normal heart sounds and intact distal pulses.  Exam reveals no gallop and no friction rub.   No murmur heard. Pulmonary/Chest: Effort normal and breath sounds normal. No respiratory distress. She has no wheezes. She has no rales.  Abdominal: Soft. She exhibits no distension. There is no tenderness. There is no guarding.  Musculoskeletal: She exhibits no edema or tenderness.  Neurological: She is alert and oriented to person, place, and time. She has normal strength. No cranial nerve deficit or sensory deficit. Coordination normal. GCS eye subscore is 4. GCS verbal subscore is 5. GCS motor subscore is 6.  Skin: Skin is warm and dry. No rash noted. She is not diaphoretic. No erythema.  Nursing note and vitals reviewed.    ED Treatments / Results  Labs (all labs ordered are listed, but only abnormal results are displayed) Labs Reviewed  POC URINE PREG, ED    EKG  EKG Interpretation None       Radiology No results found.  Procedures Procedures (including critical care time)  Medications Ordered in ED Medications  SUMAtriptan (IMITREX) injection 6 mg (6 mg Subcutaneous Given 03/15/17 1142)      Initial Impression / Assessment and Plan / ED Course  I have reviewed the triage vital signs and the nursing notes.  Pertinent labs & imaging results that were available during my care of the patient were reviewed by me and considered in my medical decision making (see chart for details).     31yo female with history of migraines with recent MRI, neurology and ophthalmology evaluation, presents with concern of headache similar to prior migraines.  Headache began slowly, no trauma, no fevers, and normal neurologic exam and have low suspicion for [facility], SDH or meningitis.  Patient was given sumatriptan with improvement in headache. Upreg negative.  Patient discharged in stable condition with understanding of reasons to return.    Final Clinical Impressions(s) / ED Diagnoses   Final diagnoses:  Other migraine  without status migrainosus, not intractable    New Prescriptions Discharge Medication List as of 03/15/2017  2:10 PM       Alvira MondaySchlossman, Marketa Midkiff, MD 03/16/17 1332

## 2017-03-17 ENCOUNTER — Encounter (HOSPITAL_COMMUNITY): Payer: Self-pay | Admitting: Emergency Medicine

## 2017-03-17 ENCOUNTER — Emergency Department (HOSPITAL_COMMUNITY)
Admission: EM | Admit: 2017-03-17 | Discharge: 2017-03-17 | Disposition: A | Discharge status: Home / Self Care | Payer: 59 | Attending: Emergency Medicine | Admitting: Emergency Medicine

## 2017-03-17 DIAGNOSIS — G43009 Migraine without aura, not intractable, without status migrainosus: Secondary | ICD-10-CM | POA: Diagnosis not present

## 2017-03-17 DIAGNOSIS — R51 Headache: Secondary | ICD-10-CM | POA: Diagnosis present

## 2017-03-17 DIAGNOSIS — J45909 Unspecified asthma, uncomplicated: Secondary | ICD-10-CM | POA: Diagnosis not present

## 2017-03-17 DIAGNOSIS — Z79899 Other long term (current) drug therapy: Secondary | ICD-10-CM | POA: Insufficient documentation

## 2017-03-17 LAB — CBG MONITORING, ED: Glucose-Capillary: 81 mg/dL (ref 65–99)

## 2017-03-17 MED ORDER — SUMATRIPTAN SUCCINATE 6 MG/0.5ML ~~LOC~~ SOLN
6.0000 mg | Freq: Once | SUBCUTANEOUS | Status: AC
  Administered 2017-03-17: 6 mg via SUBCUTANEOUS
  Filled 2017-03-17: qty 0.5

## 2017-03-17 NOTE — Discharge Instructions (Signed)
Please read attached information regarding your condition. Take home medications as previously prescribed. Follow-up with your neurologist as soon as possible for further evaluation. Return to ED for worsening pain, head injury, loss of consciousness, blurry vision, numbness, weakness or trouble walking.

## 2017-03-17 NOTE — ED Triage Notes (Signed)
Pt sts frontal HA x several days and some shaking yesterday; pt to see neuro tomorrow

## 2017-03-17 NOTE — ED Provider Notes (Signed)
[facility]PT Provider Note   CSN: [id number] Arrival date & time: 03/17/17  [id number]     History   Chief Complaint Chief Complaint  Patient presents with  . Headache    HPI [name] is a 31 y.o. female.  HPI  Patient, with past medical history of migraines, presents to ED for 1 day history of headache and several episodes of "shaking" yesterday. Unsure if the shaking is due to her blood sugar. She states that the pain in her headache is about 6 out of 10 and not as bad as usual. She denies any numbness, weakness, trouble walking, vision changes, nausea, head injury or loss of consciousness. She states that she was seen at her PCPs office yesterday after these episodes occurred and was told she had normal EKG and negative pregnancy test. She is followed by neurology at [facility] and states that she has a follow-up appointment tomorrow. She reports normal CT and MRIs of the brain in the past with the most recent one being earlier this month.  Past Medical History:  Diagnosis Date  . Arthritis    rt shoulder  . Asthma   . Biliary colic   . Depression   . DJD of shoulder    right  . GERD (gastroesophageal reflux disease)   . Headache   . Hernia of abdominal wall   . Obesity   . Sleep apnea    sleep study 2009, mod sleep apnea, no CPAP  . Wears glasses     Patient Active Problem List   Diagnosis Date Noted  . Impingement syndrome of right shoulder 11/26/2014  . AC (acromioclavicular) arthritis 11/26/2014  . S/P arthroscopy of shoulder 11/26/2014    Past Surgical History:  Procedure Laterality Date  . CHOLECYSTECTOMY N/A 06/23/2016   Procedure: LAPAROSCOPIC CHOLECYSTECTOMY;  Surgeon: Abigail Miyamotoouglas Blackman, MD;  Location: [facility] OR;  Service: General;  Laterality: N/A;  . COLONOSCOPY WITH ESOPHAGOGASTRODUODENOSCOPY (EGD)    . SHOULDER SURGERY     right  . WISDOM TOOTH EXTRACTION      OB History    Gravida Para Term Preterm AB Living   1             SAB  TAB Ectopic Multiple Live Births                   Home Medications    Prior to Admission medications   Medication Sig Start Date End Date Taking? Authorizing Provider  albuterol (PROVENTIL HFA;VENTOLIN HFA) 108 (90 BASE) MCG/ACT inhaler Inhale into the lungs every 6 (six) hours as needed for wheezing or shortness of breath.    [provider]  levonorgestrel-ethinyl estradiol (AVIANE,ALESSE,LESSINA) 0.1-20 MG-MCG tablet Take 1 tablet by mouth daily.    [provider]  meclizine (ANTIVERT) 12.5 MG tablet Take 2 tablets (25 mg total) by mouth 3 (three) times daily as needed for dizziness. 02/16/17   McDonald, Mia A, PA-C  ondansetron (ZOFRAN ODT) 4 MG disintegrating tablet Take 1 tablet (4 mg total) by mouth every 8 (eight) hours as needed for nausea or vomiting. 03/09/17   McDonald, Mia A, PA-C  rizatriptan (MAXALT) 10 MG tablet Take 1 tablet (10 mg total) by mouth as needed for migraine. May repeat in 2 hours if needed 03/09/17   McDonald, Mia A, PA-C  SUMAtriptan (IMITREX) 100 MG tablet Take 100 mg by mouth daily as needed for migraine. 02/03/17   [provider]  topiramate (TOPAMAX) 100 MG tablet Take  100 mg by mouth 2 (two) times daily. 02/02/17   [provider]    Family History Family History  Problem Relation Age of Onset  . Hypertension Mother   . Rheum arthritis Mother   . Rheum arthritis Other   . Congestive Heart Failure Other     Social History Social History  Substance Use Topics  . Smoking status: Never Smoker  . Smokeless tobacco: Never Used  . Alcohol use Yes     Comment: social     Allergies   Azithromycin; Codeine; and Toradol [ketorolac tromethamine]   Review of Systems Review of Systems  Constitutional: Positive for appetite change. Negative for chills and fever.  HENT: Negative for ear pain, rhinorrhea, sneezing and sore throat.   Eyes: Negative for photophobia and visual disturbance.  Respiratory: Negative for cough,  chest tightness, shortness of breath and wheezing.   Cardiovascular: Negative for chest pain and palpitations.  Gastrointestinal: Negative for abdominal pain, blood in stool, constipation, diarrhea, nausea and vomiting.  Genitourinary: Negative for dysuria, hematuria and urgency.  Musculoskeletal: Negative for myalgias, neck pain and neck stiffness.  Skin: Negative for rash.  Neurological: Positive for headaches. Negative for dizziness, weakness and light-headedness.     Physical Exam Updated Vital Signs BP (!) 135/99 (BP Location: Right Arm)   Pulse 100   Temp 98.4 F (36.9 C) (Oral)   Resp 18   SpO2 100%   Physical Exam  Constitutional: She is oriented to person, place, and time. She appears well-developed and well-nourished. No distress.  HENT:  Head: Normocephalic and atraumatic.  Nose: Nose normal.  Eyes: Conjunctivae and EOM are normal. Left eye exhibits no discharge. No scleral icterus.  Neck: Normal range of motion. Neck supple.  Cardiovascular: Normal rate, regular rhythm, normal heart sounds and intact distal pulses.  Exam reveals no gallop and no friction rub.   No murmur heard. Pulmonary/Chest: Effort normal and breath sounds normal. No respiratory distress.  Abdominal: Soft. Bowel sounds are normal. She exhibits no distension. There is no tenderness. There is no guarding.  Musculoskeletal: Normal range of motion. She exhibits no edema.  Neurological: She is alert and oriented to person, place, and time. No cranial nerve deficit or sensory deficit. She exhibits normal muscle tone. Coordination normal.  Pupils reactive. No facial asymmetry noted. Cranial nerves appear grossly intact. Sensation intact to light touch on face, BUE and BLE. Strength 5/5 in BUE and BLE. Normal patellar reflexes bilaterally.   Skin: Skin is warm and dry. No rash noted.  Psychiatric: She has a normal mood and affect.  Nursing note and vitals reviewed.    ED Treatments / Results   Labs (all labs ordered are listed, but only abnormal results are displayed) Labs Reviewed  CBG MONITORING, ED    EKG  EKG Interpretation None       Radiology No results found.  Procedures Procedures (including critical care time)  Medications Ordered in ED Medications  SUMAtriptan (IMITREX) injection 6 mg (6 mg Subcutaneous Given 03/17/17 1040)     Initial Impression / Assessment and Plan / ED Course  I have reviewed the triage vital signs and the nursing notes.  Pertinent labs & imaging results that were available during my care of the patient were reviewed by me and considered in my medical decision making (see chart for details).     Patient with a past medical history of migraines with recent MRI and CT, neurology evaluation presents with concerns of ongoing migraine that began  yesterday. She reports intermittent episodes of shaking and is concerned that her blood sugar might be too low. She reports compliance with her home medications for her migraines. She states that the headache began slowly with no history of trauma, injury, fevers or neck pain. She has no focal deficits on neurological exam. I have low suspicion for subarachnoid hemorrhage, subdural hematoma or meningitis based on her symptoms and history. She reports the Imitrex injection works for her here in the ED. Patient given Imitrex and reports much improvement in her symptoms. Patient states she has no point with her neurologist tomorrow for further evaluation. Advised patient to follow up as necessary. Patient appears stable for discharge at this time. Strict return precautions given for severe or worsening symptoms.  Final Clinical Impressions(s) / ED Diagnoses   Final diagnoses:  Migraine without aura and without status migrainosus, not intractable    New Prescriptions New Prescriptions   No medications on file     Dietrich Pates, PA-C 03/17/17 1118    Cathren Laine, MD 03/17/17 1244

## 2017-03-21 ENCOUNTER — Encounter (HOSPITAL_COMMUNITY): Payer: Self-pay | Admitting: *Deleted

## 2017-03-21 ENCOUNTER — Emergency Department (HOSPITAL_COMMUNITY)
Admission: EM | Admit: 2017-03-21 | Discharge: 2017-03-21 | Disposition: A | Discharge status: Home / Self Care | Payer: 59 | Attending: Physician Assistant | Admitting: Physician Assistant

## 2017-03-21 DIAGNOSIS — G43011 Migraine without aura, intractable, with status migrainosus: Secondary | ICD-10-CM | POA: Diagnosis present

## 2017-03-21 DIAGNOSIS — Z885 Allergy status to narcotic agent status: Secondary | ICD-10-CM | POA: Diagnosis not present

## 2017-03-21 DIAGNOSIS — Z79899 Other long term (current) drug therapy: Secondary | ICD-10-CM | POA: Insufficient documentation

## 2017-03-21 DIAGNOSIS — J45909 Unspecified asthma, uncomplicated: Secondary | ICD-10-CM | POA: Diagnosis not present

## 2017-03-21 NOTE — ED Triage Notes (Signed)
Pt is here with migraines and states she has been having them frequently.  Pt states headache started last night. Sensitivity to light and sound.  Pt reports nausea and dizziness.

## 2017-03-21 NOTE — ED Provider Notes (Signed)
[facility] Provider Note   CSN: [id number] Arrival date & time: 03/21/17  4010   By signing my name below, I, Clarisse Gouge, attest that this documentation has been prepared under the direction and in the presence of Zohan Shiflet Lyn, *. Electronically signed, Clarisse Gouge, ED Scribe. 03/21/17. 12:45 PM.   History   Chief Complaint Chief Complaint  Patient presents with  . Migraine   The history is provided by the patient and medical records. No language interpreter was used.    [name] is a 31 y.o. female with h/o migraines presenting to the Emergency Department concerning acute on chronic headache today. Pt states her symptoms are not severe currently, and they are consistent with past migraines. She states imitrex usually provides relief, but she has leftover doses of this at home from a prior Rx. Pt states she is follwed at [facility] by neurologist Danton Clap, MD. She expresses dissatisfaction with this provider and would like a referral to a provider with [facility]. No recent trauma/ injury noted. No other complaints at this time.   Past Medical History:  Diagnosis Date  . Arthritis    rt shoulder  . Asthma   . Biliary colic   . Depression   . DJD of shoulder    right  . GERD (gastroesophageal reflux disease)   . Headache   . Hernia of abdominal wall   . Obesity   . Sleep apnea    sleep study 2009, mod sleep apnea, no CPAP  . Wears glasses     Patient Active Problem List   Diagnosis Date Noted  . Impingement syndrome of right shoulder 11/26/2014  . AC (acromioclavicular) arthritis 11/26/2014  . S/P arthroscopy of shoulder 11/26/2014    Past Surgical History:  Procedure Laterality Date  . CHOLECYSTECTOMY N/A 06/23/2016   Procedure: LAPAROSCOPIC CHOLECYSTECTOMY;  Surgeon: Abigail Miyamoto, MD;  Location: [facility] OR;  Service: General;  Laterality: N/A;  . COLONOSCOPY WITH ESOPHAGOGASTRODUODENOSCOPY (EGD)    . SHOULDER  SURGERY     right  . WISDOM TOOTH EXTRACTION      OB History    Gravida Para Term Preterm AB Living   1             SAB TAB Ectopic Multiple Live Births                   Home Medications    Prior to Admission medications   Medication Sig Start Date End Date Taking? Authorizing Provider  albuterol (PROVENTIL HFA;VENTOLIN HFA) 108 (90 BASE) MCG/ACT inhaler Inhale into the lungs every 6 (six) hours as needed for wheezing or shortness of breath.    [provider]  levonorgestrel-ethinyl estradiol (AVIANE,ALESSE,LESSINA) 0.1-20 MG-MCG tablet Take 1 tablet by mouth daily.    [provider]  meclizine (ANTIVERT) 12.5 MG tablet Take 2 tablets (25 mg total) by mouth 3 (three) times daily as needed for dizziness. 02/16/17   McDonald, Mia A, PA-C  ondansetron (ZOFRAN ODT) 4 MG disintegrating tablet Take 1 tablet (4 mg total) by mouth every 8 (eight) hours as needed for nausea or vomiting. 03/09/17   McDonald, Mia A, PA-C  rizatriptan (MAXALT) 10 MG tablet Take 1 tablet (10 mg total) by mouth as needed for migraine. May repeat in 2 hours if needed 03/09/17   McDonald, Mia A, PA-C  SUMAtriptan (IMITREX) 100 MG tablet Take 100 mg by mouth daily as needed for migraine. 02/03/17   [provider]  topiramate (TOPAMAX) 100 MG tablet Take 100 mg by mouth 2 (two) times daily. 02/02/17   [provider]    Family History Family History  Problem Relation Age of Onset  . Hypertension Mother   . Rheum arthritis Mother   . Rheum arthritis Other   . Congestive Heart Failure Other     Social History Social History  Substance Use Topics  . Smoking status: Never Smoker  . Smokeless tobacco: Never Used  . Alcohol use Yes     Comment: social     Allergies   Azithromycin; Codeine; and Toradol [ketorolac tromethamine]   Review of Systems Review of Systems  Constitutional: Negative for chills and fever.  Gastrointestinal: Negative for nausea and vomiting.  Skin:  Negative for wound.  Neurological: Positive for headaches. Negative for dizziness, weakness and numbness.  All other systems reviewed and are negative.    Physical Exam Updated Vital Signs BP 140/73 (BP Location: Left Arm)   Pulse (!) 111   Temp 98.4 F (36.9 C) (Oral)   Resp 20   LMP 03/21/2017 (Exact Date)   SpO2 99%   Physical Exam  Constitutional: She is oriented to person, place, and time. She appears well-developed and well-nourished.  HENT:  Head: Normocephalic.  Eyes: Pupils are equal, round, and reactive to light. Conjunctivae and EOM are normal.  Neck: Normal range of motion.  Cardiovascular: Normal rate, regular rhythm and normal heart sounds.  Exam reveals no gallop and no friction rub.   No murmur heard. Pulmonary/Chest: Effort normal. No respiratory distress. She has no wheezes.  Abdominal: She exhibits no distension. There is no tenderness.  Musculoskeletal: Normal range of motion. She exhibits no tenderness.  Neurological: She is alert and oriented to person, place, and time.  Skin: Skin is warm. No erythema.  Psychiatric: She has a normal mood and affect.  Nursing note and vitals reviewed.    ED Treatments / Results  DIAGNOSTIC STUDIES: Oxygen Saturation is 99% on RA, NL by my interpretation.    COORDINATION OF CARE: 12:24 PM-Discussed next steps with pt. Pt verbalized understanding and is agreeable with the plan. Will order referral. Pt offered pain medications, which she denies at this time.   Labs (all labs ordered are listed, but only abnormal results are displayed) Labs Reviewed - No data to display  EKG  EKG Interpretation None       Radiology No results found.  Procedures Procedures (including critical care time)  Medications Ordered in ED Medications - No data to display   Initial Impression / Assessment and Plan / ED Course  I have reviewed the triage vital signs and the nursing notes.  Pertinent labs & imaging results that  were available during my care of the patient were reviewed by me and considered in my medical decision making (see chart for details).     Patient is a 31 year old female who has chronic migraines. She's been set up with neurology by her primary care physician but she does not like this neurologist. She is here asking for referral for different neurologist. We will give her [place]'s number. Patient does not feel like she has a headache bad enough to treat right now. She has Imitrex at home to use.  I personally performed the services described in this documentation, which was scribed in my presence. The recorded information has been reviewed and is accurate.     Final Clinical Impressions(s) / ED Diagnoses   Final diagnoses:  None  New Prescriptions New Prescriptions   No medications on file  I personally performed the services described in this documentation, which was scribed in my presence. The recorded information has been reviewed and is accurate.      Abelino DerrickMackuen, Michaeal Davis Lyn, MD 03/21/17 1250

## 2017-03-21 NOTE — ED Notes (Signed)
Constant migraine since last night.  Photosensitivity, pain 6/10, states she drove herself and does not want phenergan.  States she normally only receives an imitrex injection and the migraine subsides.

## 2017-03-21 NOTE — ED Notes (Signed)
PT acknowledges taking all belongings home with her. No questions about her DC instructions

## 2017-04-10 ENCOUNTER — Emergency Department (HOSPITAL_COMMUNITY)
Admission: EM | Admit: 2017-04-10 | Discharge: 2017-04-10 | Disposition: A | Discharge status: Home / Self Care | Payer: 59 | Attending: Emergency Medicine | Admitting: Emergency Medicine

## 2017-04-10 ENCOUNTER — Encounter (HOSPITAL_COMMUNITY): Payer: Self-pay | Admitting: Emergency Medicine

## 2017-04-10 DIAGNOSIS — G43009 Migraine without aura, not intractable, without status migrainosus: Secondary | ICD-10-CM

## 2017-04-10 DIAGNOSIS — R51 Headache: Secondary | ICD-10-CM | POA: Diagnosis present

## 2017-04-10 DIAGNOSIS — Z79899 Other long term (current) drug therapy: Secondary | ICD-10-CM | POA: Insufficient documentation

## 2017-04-10 DIAGNOSIS — J45909 Unspecified asthma, uncomplicated: Secondary | ICD-10-CM | POA: Insufficient documentation

## 2017-04-10 MED ORDER — SODIUM CHLORIDE 0.9 % IV BOLUS (SEPSIS)
1000.0000 mL | Freq: Once | INTRAVENOUS | Status: AC
Start: 2017-04-10 — End: 2017-04-10
  Administered 2017-04-10: 1000 mL via INTRAVENOUS

## 2017-04-10 MED ORDER — DIPHENHYDRAMINE HCL 50 MG/ML IJ SOLN
25.0000 mg | Freq: Once | INTRAMUSCULAR | Status: AC
  Administered 2017-04-10: 25 mg via INTRAVENOUS
  Filled 2017-04-10: qty 1

## 2017-04-10 MED ORDER — DEXAMETHASONE SODIUM PHOSPHATE 10 MG/ML IJ SOLN
10.0000 mg | Freq: Once | INTRAMUSCULAR | Status: AC
  Administered 2017-04-10: 10 mg via INTRAVENOUS
  Filled 2017-04-10: qty 1

## 2017-04-10 MED ORDER — PROCHLORPERAZINE EDISYLATE 5 MG/ML IJ SOLN
10.0000 mg | Freq: Once | INTRAMUSCULAR | Status: AC
  Administered 2017-04-10: 10 mg via INTRAVENOUS
  Filled 2017-04-10: qty 2

## 2017-04-10 NOTE — ED Provider Notes (Signed)
[facility] Provider Note   CSN: [id number] Arrival date & time: 04/10/17  4098     History   Chief Complaint Chief Complaint  Patient presents with  . Headache    HPI [name] is a 31 y.o. female.  HPI Patient presents to the emergency department with migraine headache that started several days ago.  The patient states she has had intermittent headache over the last 4-5 days.  She states that she did take some medications at home and seemed to help, but did not totally relieve her symptoms.  The patient states that nothing seems make her condition better.  She has had some nausea and some light sensitivity.  Patient states that her Imitrex normally works, but did not help this time.  She states that she did not follow up with a neurologist like she had intended because she lost the paperwork from her previous visit.  The patient states that this feels typical of her migraine headaches.  The patient denies chest pain, shortness of breath, blurred vision, neck pain, fever, cough, weakness, numbness, dizziness, anorexia, edema, abdominal pain, nausea, vomiting, diarrhea, rash, back pain, dysuria, hematemesis, bloody stool, near syncope, or syncope. Past Medical History:  Diagnosis Date  . Arthritis    rt shoulder  . Asthma   . Biliary colic   . Depression   . DJD of shoulder    right  . GERD (gastroesophageal reflux disease)   . Headache   . Hernia of abdominal wall   . Obesity   . Sleep apnea    sleep study 2009, mod sleep apnea, no CPAP  . Wears glasses     Patient Active Problem List   Diagnosis Date Noted  . Impingement syndrome of right shoulder 11/26/2014  . AC (acromioclavicular) arthritis 11/26/2014  . S/P arthroscopy of shoulder 11/26/2014    Past Surgical History:  Procedure Laterality Date  . CHOLECYSTECTOMY N/A 06/23/2016   Procedure: LAPAROSCOPIC CHOLECYSTECTOMY;  Surgeon: Abigail Miyamoto, MD;  Location: [facility] OR;  Service: General;  Laterality:  N/A;  . COLONOSCOPY WITH ESOPHAGOGASTRODUODENOSCOPY (EGD)    . SHOULDER SURGERY     right  . WISDOM TOOTH EXTRACTION      OB History    Gravida Para Term Preterm AB Living   1             SAB TAB Ectopic Multiple Live Births                   Home Medications    Prior to Admission medications   Medication Sig Start Date End Date Taking? Authorizing Provider  albuterol (PROVENTIL HFA;VENTOLIN HFA) 108 (90 BASE) MCG/ACT inhaler Inhale into the lungs every 6 (six) hours as needed for wheezing or shortness of breath.   Yes [provider]  levonorgestrel-ethinyl estradiol (AVIANE,ALESSE,LESSINA) 0.1-20 MG-MCG tablet Take 1 tablet by mouth daily.   Yes [provider]  meclizine (ANTIVERT) 12.5 MG tablet Take 2 tablets (25 mg total) by mouth 3 (three) times daily as needed for dizziness. 02/16/17  Yes McDonald, Mia A, PA-C  ondansetron (ZOFRAN ODT) 4 MG disintegrating tablet Take 1 tablet (4 mg total) by mouth every 8 (eight) hours as needed for nausea or vomiting. 03/09/17  Yes McDonald, Mia A, PA-C  rizatriptan (MAXALT) 10 MG tablet Take 1 tablet (10 mg total) by mouth as needed for migraine. May repeat in 2 hours if needed 03/09/17  Yes McDonald, Mia A, PA-C  SUMAtriptan (IMITREX) 100 MG tablet  Take 100 mg by mouth daily as needed for migraine. 02/03/17  Yes [provider]  topiramate (TOPAMAX) 100 MG tablet Take 100 mg by mouth 2 (two) times daily. 02/02/17  Yes [provider]    Family History Family History  Problem Relation Age of Onset  . Hypertension Mother   . Rheum arthritis Mother   . Rheum arthritis Other   . Congestive Heart Failure Other     Social History Social History  Substance Use Topics  . Smoking status: Never Smoker  . Smokeless tobacco: Never Used  . Alcohol use Yes     Comment: social     Allergies   Azithromycin; Codeine; and Toradol [ketorolac tromethamine]   Review of Systems Review of Systems All other  systems negative except as documented in the HPI. All pertinent positives and negatives as reviewed in the HPI.  Physical Exam Updated Vital Signs BP 113/68   Pulse 65   Temp 98.8 F (37.1 C) (Oral)   Resp 16   Ht 5\' 3"  (1.6 m)   Wt 117 kg (258 lb)   LMP 03/21/2017 (Exact Date)   SpO2 98%   BMI 45.70 kg/m   Physical Exam  Constitutional: She is oriented to person, place, and time. She appears well-developed and well-nourished. No distress.  HENT:  Head: Normocephalic and atraumatic.  Mouth/Throat: Oropharynx is clear and moist.  Eyes: Pupils are equal, round, and reactive to light.  Neck: Normal range of motion. Neck supple.  Cardiovascular: Normal rate, regular rhythm and normal heart sounds.  Exam reveals no gallop and no friction rub.   No murmur heard. Pulmonary/Chest: Effort normal and breath sounds normal. No respiratory distress. She has no wheezes.  Neurological: She is alert and oriented to person, place, and time. No sensory deficit. She exhibits normal muscle tone. Coordination normal.  Skin: Skin is warm and dry. Capillary refill takes less than 2 seconds. No rash noted. No erythema.  Psychiatric: She has a normal mood and affect. Her behavior is normal.  Nursing note and vitals reviewed.    ED Treatments / Results  Labs (all labs ordered are listed, but only abnormal results are displayed) Labs Reviewed - No data to display  EKG  EKG Interpretation None       Radiology No results found.  Procedures Procedures (including critical care time)  Medications Ordered in ED Medications  sodium chloride 0.9 % bolus 1,000 mL (1,000 mLs Intravenous New Bag/Given 04/10/17 1111)  prochlorperazine (COMPAZINE) injection 10 mg (10 mg Intravenous Given 04/10/17 1111)  diphenhydrAMINE (BENADRYL) injection 25 mg (25 mg Intravenous Given 04/10/17 1111)  dexamethasone (DECADRON) injection 10 mg (10 mg Intravenous Given 04/10/17 1111)     Initial Impression / Assessment  and Plan / ED Course  I have reviewed the triage vital signs and the nursing notes.  Pertinent labs & imaging results that were available during my care of the patient were reviewed by me and considered in my medical decision making (see chart for details).   patient be treated for migraine headache.  She is feeling better following the [place] cocktail and fluids.  Patient would like to be discharged home.  I have given the patient follow-up with neurology.  Told to return here as needed.  Patient agrees the plan and all questions were answered.  There is no neurological deficits on the patient's exam    Final Clinical Impressions(s) / ED Diagnoses   Final diagnoses:  None    New Prescriptions  New Prescriptions   No medications on file     Charlestine Night, Cordelia Poche 04/10/17 1441    Shaune Pollack, MD 04/12/17 1236

## 2017-04-10 NOTE — ED Triage Notes (Signed)
Pt states "ive had a really bad headache that started last Sunday with sensitivity to light." Pt has had migraines x3 months. Pt states they've done several tests and everything comes back normal. Pt states she comes here and gets a shot of imitrex and it seems to help. Pt ambulatory. No neuro deficits.

## 2017-04-10 NOTE — Discharge Instructions (Signed)
Return here as needed.  Follow-up with the neurologist provided °

## 2017-05-09 ENCOUNTER — Inpatient Hospital Stay (HOSPITAL_COMMUNITY)
Admission: AD | Admit: 2017-05-09 | Discharge: 2017-05-09 | Disposition: A | Discharge status: Home / Self Care | Payer: 59 | Source: Ambulatory Visit | Attending: Obstetrics and Gynecology | Admitting: Obstetrics and Gynecology

## 2017-05-09 DIAGNOSIS — Z3201 Encounter for pregnancy test, result positive: Secondary | ICD-10-CM

## 2017-05-09 NOTE — MAU Note (Signed)
Pt here to verify pregnancy. Pt denies pain and bleeding.

## 2017-05-09 NOTE — MAU Provider Note (Signed)
S:   31 y.o. G1P0 @Unknown  by LMP presents to MAU for pregnancy confirmation.  She denies abdominal pain or vaginal bleeding today.    Wants pregnancy verification and ultrasound "to decide if I am going to have an abortion".  Had + UPT at home.  States she and her husband "have trouble taking care of ourselves, so we don't think we can take care of a little one".  Sees Dr Mora ApplPinn for GYN care.  Hx PCOS, stopped OCPs due to headaches.  Has never been pregnant before  O: There were no vitals taken for this visit. Physical Examination: General appearance - alert, well appearing, and in no distress, oriented to person, place, and time and acyanotic, in no respiratory distress  No results found for this or any previous visit (from the past 48 hour(s)).  A: Positive pregnancy test at home   P: D/C home Offered pregnancy test as walkin tomorrow in clinic Conversely she can call Dr Madalyn RobPinn's office tomorrow for test and US Return to MAU as needed for emergencies  Wynelle BourgeoisMarie Danelly Hassinger, CNM 1:10 AM

## 2017-05-09 NOTE — Progress Notes (Signed)
Artelia LarocheM. Williams, CNM in to see pt in triage. Discussed with pt different modes of getting pregnancy test. Pt without complaints.

## 2017-05-19 ENCOUNTER — Encounter: Payer: Self-pay | Admitting: Neurology

## 2017-05-19 ENCOUNTER — Encounter (INDEPENDENT_AMBULATORY_CARE_PROVIDER_SITE_OTHER): Payer: Self-pay

## 2017-05-19 ENCOUNTER — Ambulatory Visit (INDEPENDENT_AMBULATORY_CARE_PROVIDER_SITE_OTHER): Payer: 59 | Admitting: Neurology

## 2017-05-19 VITALS — BP 131/89 | HR 82 | Ht 63.0 in | Wt 260.0 lb

## 2017-05-19 DIAGNOSIS — R51 Headache: Secondary | ICD-10-CM

## 2017-05-19 DIAGNOSIS — R519 Headache, unspecified: Secondary | ICD-10-CM

## 2017-05-19 DIAGNOSIS — G4733 Obstructive sleep apnea (adult) (pediatric): Secondary | ICD-10-CM

## 2017-05-19 DIAGNOSIS — G43001 Migraine without aura, not intractable, with status migrainosus: Secondary | ICD-10-CM | POA: Diagnosis not present

## 2017-05-19 DIAGNOSIS — G43909 Migraine, unspecified, not intractable, without status migrainosus: Secondary | ICD-10-CM | POA: Insufficient documentation

## 2017-05-19 DIAGNOSIS — G932 Benign intracranial hypertension: Secondary | ICD-10-CM | POA: Diagnosis not present

## 2017-05-19 NOTE — Progress Notes (Signed)
[facility]    Provider:  Dr Lucia Gaskins Referring Provider: Center, [facility],  Salli Real, MD Primary Care Physician:  Salli Real, MD  CC:  Headaches  HPI:  [name] is a 31 y.o. female here as a referral from Dr. Eli Phillips for  for headaches. Medical history of hyperlipidemia, obesity, migraine, mild intermittent asthma. She has recently stopped taking all her medication because of a positive pregnancy test. Migraines started in her teens but very rare, before 4 months ago she has had 2 migraines in her life, no FHx of migraines. 4 months the migraines became worse, more frequent, in the forehead and behind the eyes, she was having them daily. She is waking up with the headaches. They are more pressure, severe pressure, sometimes throbbing, sometimes she can feel heartbeat in her head, worse positionally, she has loud noise in her ears, no aura but she has spots that come and go, had a normal dilated eye exam recently. She has not had a lumbar puncture. No inciting events. It has been keeping her from work. She has 20 headache days a month. She gets light, sound and movement sensitivity, nausea, can cause confusion and she can't make her words come out of her mouth. She has neck pain, muscular and tight. The headaches can last days. On average 6/10 and can be 10/10 and severe, vomiting as well. Medications have not helped. No medication overuse.   Reviewed notes, labs and imaging from outside physicians, which showed:  Personally reviewed MRI of the brain images which were normal.  Labs drawn include magnesium which was normal, CMP showed low sodium 135, chloride 108, CO2 19, anion 8, creatinine 1.2 BUN 12 these were taken channel 17th 2018, CBC was unremarkable LDL 142, TSH normal.  Reviewed previous history. Patient has a history of migraines. She has been on multiple medications including Zofran, topiramate, sumatriptan oral, oxycodone, nortriptyline, propranolol. Exam was  normal including neurologic exam. When patient was seen she had a severe headache, pulsating with nausea and vomiting worse in the mornings with concern for intracranial hypertension in the setting of being overweight and on birth control. CT June 2018 showed dural calcification. The classification by history is migraine headache and chronic daily headaches, nausea, vomiting, photophobia, phonophobia, aura, numbness and tingling in the left periorbital area, left frontal area, left temporal area also can be on the right in these areas with radiation, sharp, dull aching and throbbing, lasting for days and occurring daily. Symptoms are exacerbated by noise, movement, leg, stress, smells and odors sleep deprivation. Relieved by rest ice opioids and migraine medications. Associated vertigo, confusion tenderness. Having difficulty with activities of daily living.  Review of Systems: Patient complains of symptoms per HPI as well as the following symptoms: headache. Pertinent negatives and positives per HPI. All others negative.   Social History   Social History  . Marital status: Married    Spouse name: N/A  . Number of children: N/A  . Years of education: N/A   Occupational History  . Not on file.   Social History Main Topics  . Smoking status: Never Smoker  . Smokeless tobacco: Never Used  . Alcohol use Yes     Comment: social  . Drug use: No  . Sexual activity: Yes   Other Topics Concern  . Not on file   Social History Narrative  . No narrative on file    Family History  Problem Relation Age of Onset  . Hypertension Mother   .  Rheum arthritis Mother   . Rheum arthritis Other   . Congestive Heart Failure Other     Past Medical History:  Diagnosis Date  . Arthritis    rt shoulder  . Asthma   . Biliary colic   . DJD of shoulder    right  . GERD (gastroesophageal reflux disease)   . Headache   . Hernia of abdominal wall   . Obesity   . Polycystic ovarian syndrome   .  Sleep apnea    sleep study 2009, mod sleep apnea, no CPAP  . Wears glasses     Past Surgical History:  Procedure Laterality Date  . CHOLECYSTECTOMY N/A 06/23/2016   Procedure: LAPAROSCOPIC CHOLECYSTECTOMY;  Surgeon: Abigail Miyamoto, MD;  Location: [facility] OR;  Service: General;  Laterality: N/A;  . COLONOSCOPY WITH ESOPHAGOGASTRODUODENOSCOPY (EGD)    . SHOULDER SURGERY     right  . WISDOM TOOTH EXTRACTION      Current Outpatient Prescriptions  Medication Sig Dispense Refill  . albuterol (PROVENTIL HFA;VENTOLIN HFA) 108 (90 BASE) MCG/ACT inhaler Inhale into the lungs every 6 (six) hours as needed for wheezing or shortness of breath.    . levonorgestrel-ethinyl estradiol (AVIANE,ALESSE,LESSINA) 0.1-20 MG-MCG tablet Take 1 tablet by mouth daily.    . meclizine (ANTIVERT) 12.5 MG tablet Take 2 tablets (25 mg total) by mouth 3 (three) times daily as needed for dizziness. 30 tablet 0  . nortriptyline (PAMELOR) 25 MG capsule Take 100 mg by mouth at bedtime.    . ondansetron (ZOFRAN ODT) 4 MG disintegrating tablet Take 1 tablet (4 mg total) by mouth every 8 (eight) hours as needed for nausea or vomiting. 20 tablet 0  . oxyCODONE-acetaminophen (PERCOCET/ROXICET) 5-325 MG tablet Take by mouth every 4 (four) hours as needed for severe pain.    . promethazine (PHENERGAN) 12.5 MG tablet Take 12.5 mg by mouth every 6 (six) hours as needed for nausea or vomiting.    . promethazine (PHENERGAN) 25 MG tablet Take 25 mg by mouth every 6 (six) hours as needed for nausea or vomiting.    . propranolol ER (INDERAL LA) 60 MG 24 hr capsule Take 60 mg by mouth daily.    . SUMAtriptan (IMITREX) 100 MG tablet Take 100 mg by mouth daily as needed for migraine.  2  . topiramate (TOPAMAX) 100 MG tablet Take 100 mg by mouth 2 (two) times daily.  1   No current facility-administered medications for this visit.     Allergies as of 05/19/2017 - Review Complete 05/19/2017  Allergen Reaction Noted  . Azithromycin Other  (See Comments) 09/07/2013  . Codeine Hives 09/07/2013  . Toradol [ketorolac tromethamine] Rash 02/16/2017    Vitals: BP 131/89 (BP Location: Right Arm, Patient Position: Sitting, Cuff Size: Normal)   Pulse 82   Ht  (1.6 m)   Wt 260 lb (117.9 kg)   BMI 46.06 kg/m  Last Weight:  Wt Readings from Last 1 Encounters:  05/19/17 260 lb (117.9 kg)   Last Height:   Ht Readings from Last 1 Encounters:  05/19/17  (1.6 m)   Physical exam: Exam: Gen: NAD, conversant, well nourised, obese, well groomed                     CV: RRR, no MRG. No Carotid Bruits. No peripheral edema, warm, nontender Eyes: Conjunctivae clear without exudates or hemorrhage  Neuro: Detailed Neurologic Exam  Speech:    Speech is normal; fluent and spontaneous with  normal comprehension.  Cognition:    The patient is oriented to person, place, and time;     recent and remote memory intact;     language fluent;     normal attention, concentration,     fund of knowledge Cranial Nerves:    The pupils are equal, round, and reactive to light. Blurring of the left disc (papilledema?). Visual fields are full to finger confrontation. Extraocular movements are intact. Trigeminal sensation is intact and the muscles of mastication are normal. The face is symmetric. The palate elevates in the midline. Hearing intact. Voice is normal. Shoulder shrug is normal. The tongue has normal motion without fasciculations.   Coordination:    Normal finger to nose and heel to shin. Normal rapid alternating movements.   Gait:    Heel-toe and tandem gait are normal.   Motor Observation:    No asymmetry, no atrophy, and no involuntary movements noted. Tone:    Normal muscle tone.    Posture:    Posture is normal. normal erect    Strength:    Strength is V/V in the upper and lower limbs.      Sensation: intact to LT     Reflex Exam:  DTR's:    Deep tendon reflexes in the upper and lower extremities are normal  bilaterally.   Toes:    The toes are downgoing bilaterally.   Clonus:    Clonus is absent.       Assessment/Plan:  31 year old morbidly obese female here for evaluation of headaches. Patient has a history of rare migraines, new headache started approximately 4 months ago however they're different in quality more pressure around the head, can hear heartbeat in her head, worse positionally, hearing changes. MRI of the brain was unremarkable.  Given positional quality of headache, pressure, hearing changes such as heartbeat in the ear, morbid obesity, I question whether there is some intracranial hypertension despite normal MRI. I suggest a lumbar puncture however patient is possibly pregnant will have to hold off, recent vision testing was normal. If patient decides to have the baby we can start Diamox if patient becomes more symptomatic, would prefer to start in the second trimester.  Sleep apnea diagnosed in the past, untreated with morning positional headaches, fatigue, morbid obesity: Patient needs sleep study  Healthy weight and wellness center: Discussed morbid obesity, health risks including the association with idiopathic intracranial hypertension.  Discussed intracranial idiopathic hypertension untreated can cause permanent vision loss, for any acute vision changes or directly to the emergency room. Also discussed untreated obstructive sleep apnea especially sequela of increased risk of stroke cardiovascular disease.  Discussed: To prevent or relieve headaches, try the following: Cool Compress. Lie down and place a cool compress on your head.  Avoid headache triggers. If certain foods or odors seem to have triggered your migraines in the past, avoid them. A headache diary might help you identify triggers.  Include physical activity in your daily routine. Try a daily walk or other moderate aerobic exercise.  Manage stress. Find healthy ways to cope with the stressors, such as delegating  tasks on your to-do list.  Practice relaxation techniques. Try deep breathing, yoga, massage and visualization.  Eat regularly. Eating regularly scheduled meals and maintaining a healthy diet might help prevent headaches. Also, drink plenty of fluids.  Follow a regular sleep schedule. Sleep deprivation might contribute to headaches Consider biofeedback. With this mind-body technique, you learn to control certain bodily functions - such as muscle tension, heart  rate and blood pressure - to prevent headaches or reduce headache pain.    Proceed to emergency room if you experience new or worsening symptoms or symptoms do not resolve, if you have new neurologic symptoms or if headache is severe, or for any concerning symptom.   Provided education and documentation from American headache Society toolbox including articles on: chronic migraine medication overuse headache, chronic migraines, prevention of migraines, behavioral and other nonpharmacologic treatments for headache.  Orders Placed This Encounter  Procedures  . Ambulatory referral to Sleep Studies  . Ambulatory referral to Family Practice   Cc:  Salli Real, MD  Naomie Dean, MD  [facility] [address] [place], [place] [address]  Phone [phone] Fax [phone]

## 2017-05-19 NOTE — Patient Instructions (Addendum)
Idiopathic Intracranial Hypertension Idiopathic intracranial hypertension (IIH) is a condition that increases pressure around the brain. The fluid that surrounds the brain and spinal cord (cerebrospinal fluid, CSF) increases and causes the pressure. Idiopathic means that the cause of this condition is not known. IIH affects the brain and spinal cord (is a neurological disorder). If this condition is not treated, it can cause vision loss or blindness. What increases the risk? You are more likely to develop this condition if:  You are severely overweight (obese).  You are a woman who has not gone through menopause.  You take certain medicines, such as birth control or steroids.  What are the signs or symptoms? Symptoms of IIH include:  Headaches. This is the most common symptom.  Pain in the shoulders or neck.  Nausea and vomiting.  A "rushing water" or pulsing sound within the ears (pulsatile tinnitus).  Double vision.  Blurred vision.  Brief episodes of complete vision loss.  How is this diagnosed? This condition may be diagnosed based on:  Your symptoms.  Your medical history.  CT scan of the brain.  MRI of the brain.  Magnetic resonance venogram (MRV) to check veins in the brain.  Diagnostic lumbar puncture. This is a procedure to remove and examine a sample of cerebrospinal fluid. This procedure can determine whether too much fluid may be causing IIH.  A thorough eye exam to check for swelling or nerve damage in the eyes.  How is this treated? Treatment for this condition depends on your symptoms. The goal of treatment is to decrease the pressure around your brain. Common treatments include:  Medicines to decrease the production of spinal fluid and lower the pressure within your skull.  Medicines to prevent or treat headaches.  Surgery to place drains (shunts) in your brain to remove excess fluid.  Lumbar puncture to remove excess cerebrospinal  fluid.  Follow these instructions at home:  If you are overweight or obese, work with your health care provider to lose weight.  Take over-the-counter and prescription medicines only as told by your health care provider.  Do not drive or use heavy machinery while taking medicines that can make you sleepy.  Keep all follow-up visits as told by your health care provider. This is important. Contact a health care provider if:  You have changes in your vision, such as: ? Double vision. ? Not being able to see colors (color vision). Get help right away if:  You have any of the following symptoms and they get worse or do not get better. ? Headaches. ? Nausea. ? Vomiting. ? Vision changes or difficulty seeing. Summary  Idiopathic intracranial hypertension (IIH) is a condition that increases pressure around the brain. The cause is not known (is idiopathic).  The most common symptom of IIH is headaches.  Treatment may include medicines or surgery to relieve the pressure on your brain. This information is not intended to replace advice given to you by your health care provider. Make sure you discuss any questions you have with your health care provider. Document Released: 10/25/2001 Document Revised: 07/07/2016 Document Reviewed: 07/07/2016 Elsevier Interactive Patient Education  2017 [employer]   Sleep Apnea Sleep apnea is a condition in which breathing pauses or becomes shallow during sleep. Episodes of sleep apnea usually last 10 seconds or longer, and they may occur as many as 20 times an hour. Sleep apnea disrupts your sleep and keeps your body from getting the rest that it needs. This condition can increase  your risk of certain health problems, including:  Heart attack.  Stroke.  Obesity.  Diabetes.  Heart failure.  Irregular heartbeat.  There are three kinds of sleep apnea:  Obstructive sleep apnea. This kind is caused by a blocked or collapsed airway.  Central  sleep apnea. This kind happens when the part of the brain that controls breathing does not send the correct signals to the muscles that control breathing.  Mixed sleep apnea. This is a combination of obstructive and central sleep apnea.  What are the causes? The most common cause of this condition is a collapsed or blocked airway. An airway can collapse or become blocked if:  Your throat muscles are abnormally relaxed.  Your tongue and tonsils are larger than normal.  You are overweight.  Your airway is smaller than normal.  What increases the risk? This condition is more likely to develop in people who:  Are overweight.  Smoke.  Have a smaller than normal airway.  Are elderly.  Are female.  Drink alcohol.  Take sedatives or tranquilizers.  Have a family history of sleep apnea.  What are the signs or symptoms? Symptoms of this condition include:  Trouble staying asleep.  Daytime sleepiness and tiredness.  Irritability.  Loud snoring.  Morning headaches.  Trouble concentrating.  Forgetfulness.  Decreased interest in sex.  Unexplained sleepiness.  Mood swings.  Personality changes.  Feelings of depression.  Waking up often during the night to urinate.  Dry mouth.  Sore throat.  How is this diagnosed? This condition may be diagnosed with:  A medical history.  A physical exam.  A series of tests that are done while you are sleeping (sleep study). These tests are usually done in a sleep lab, but they may also be done at home.  How is this treated? Treatment for this condition aims to restore normal breathing and to ease symptoms during sleep. It may involve managing health issues that can affect breathing, such as high blood pressure or obesity. Treatment may include:  Sleeping on your side.  Using a decongestant if you have nasal congestion.  Avoiding the use of depressants, including alcohol, sedatives, and narcotics.  Losing weight if  you are overweight.  Making changes to your diet.  Quitting smoking.  Using a device to open your airway while you sleep, such as: ? An oral appliance. This is a custom-made mouthpiece that shifts your lower jaw forward. ? A continuous positive airway pressure (CPAP) device. This device delivers oxygen to your airway through a mask. ? A nasal expiratory positive airway pressure (EPAP) device. This device has valves that you put into each nostril. ? A bi-level positive airway pressure (BPAP) device. This device delivers oxygen to your airway through a mask.  Surgery if other treatments do not work. During surgery, excess tissue is removed to create a wider airway.  It is important to get treatment for sleep apnea. Without treatment, this condition can lead to:  High blood pressure.  Coronary artery disease.  (Men) An inability to achieve or maintain an erection (impotence).  Reduced thinking abilities.  Follow these instructions at home:  Make any lifestyle changes that your health care provider recommends.  Eat a healthy, well-balanced diet.  Take over-the-counter and prescription medicines only as told by your health care provider.  Avoid using depressants, including alcohol, sedatives, and narcotics.  Take steps to lose weight if you are overweight.  If you were given a device to open your airway while you sleep,  use it only as told by your health care provider.  Do not use any tobacco products, such as cigarettes, chewing tobacco, and e-cigarettes. If you need help quitting, ask your health care provider.  Keep all follow-up visits as told by your health care provider. This is important. Contact a health care provider if:  The device that you received to open your airway during sleep is uncomfortable or does not seem to be working.  Your symptoms do not improve.  Your symptoms get worse. Get help right away if:  You develop chest pain.  You develop shortness of  breath.  You develop discomfort in your back, arms, or stomach.  You have trouble speaking.  You have weakness on one side of your body.  You have drooping in your face. These symptoms may represent a serious problem that is an emergency. Do not wait to see if the symptoms will go away. Get medical help right away. Call your local emergency services (911 in the [place] Do not drive yourself to the hospital. This information is not intended to replace advice given to you by your health care provider. Make sure you discuss any questions you have with your health care provider. Document Released: 08/06/2002 Document Revised: 04/11/2016 Document Reviewed: 05/26/2015 Elsevier Interactive Patient Education  [employer].

## 2017-05-22 ENCOUNTER — Emergency Department (HOSPITAL_COMMUNITY): Payer: 59

## 2017-05-22 ENCOUNTER — Emergency Department (HOSPITAL_COMMUNITY)
Admission: EM | Admit: 2017-05-22 | Discharge: 2017-05-22 | Disposition: A | Discharge status: Home / Self Care | Payer: 59 | Attending: Emergency Medicine | Admitting: Emergency Medicine

## 2017-05-22 ENCOUNTER — Encounter (HOSPITAL_COMMUNITY): Payer: Self-pay | Admitting: *Deleted

## 2017-05-22 DIAGNOSIS — Z885 Allergy status to narcotic agent status: Secondary | ICD-10-CM | POA: Diagnosis not present

## 2017-05-22 DIAGNOSIS — R51 Headache: Secondary | ICD-10-CM | POA: Diagnosis present

## 2017-05-22 DIAGNOSIS — Z3A08 8 weeks gestation of pregnancy: Secondary | ICD-10-CM | POA: Insufficient documentation

## 2017-05-22 DIAGNOSIS — Z79899 Other long term (current) drug therapy: Secondary | ICD-10-CM | POA: Insufficient documentation

## 2017-05-22 DIAGNOSIS — R109 Unspecified abdominal pain: Secondary | ICD-10-CM | POA: Diagnosis not present

## 2017-05-22 DIAGNOSIS — O26891 Other specified pregnancy related conditions, first trimester: Secondary | ICD-10-CM | POA: Insufficient documentation

## 2017-05-22 DIAGNOSIS — J45909 Unspecified asthma, uncomplicated: Secondary | ICD-10-CM | POA: Diagnosis not present

## 2017-05-22 DIAGNOSIS — Z23 Encounter for immunization: Secondary | ICD-10-CM | POA: Diagnosis not present

## 2017-05-22 LAB — CBC
HCT: 40.7 % (ref 36.0–46.0)
HEMOGLOBIN: 13.7 g/dL (ref 12.0–15.0)
MCH: 28.5 pg (ref 26.0–34.0)
MCHC: 33.7 g/dL (ref 30.0–36.0)
MCV: 84.6 fL (ref 78.0–100.0)
PLATELETS: 210 10*3/uL (ref 150–400)
RBC: 4.81 MIL/uL (ref 3.87–5.11)
RDW: 12.4 % (ref 11.5–15.5)
WBC: 7.9 10*3/uL (ref 4.0–10.5)

## 2017-05-22 LAB — ABO/RH: ABO/RH(D): O NEG

## 2017-05-22 LAB — URINALYSIS, ROUTINE W REFLEX MICROSCOPIC
Bilirubin Urine: NEGATIVE
Glucose, UA: NEGATIVE mg/dL
KETONES UR: 5 mg/dL — AB
Nitrite: NEGATIVE
PROTEIN: NEGATIVE mg/dL
Specific Gravity, Urine: 1.021 (ref 1.005–1.030)
pH: 5 (ref 5.0–8.0)

## 2017-05-22 LAB — COMPREHENSIVE METABOLIC PANEL
ALBUMIN: 3.9 g/dL (ref 3.5–5.0)
ALK PHOS: 50 U/L (ref 38–126)
ALT: 12 U/L — AB (ref 14–54)
ANION GAP: 8 (ref 5–15)
AST: 17 U/L (ref 15–41)
BILIRUBIN TOTAL: 1.2 mg/dL (ref 0.3–1.2)
BUN: 5 mg/dL — AB (ref 6–20)
CO2: 21 mmol/L — ABNORMAL LOW (ref 22–32)
CREATININE: 0.55 mg/dL (ref 0.44–1.00)
Calcium: 9 mg/dL (ref 8.9–10.3)
Chloride: 105 mmol/L (ref 101–111)
GFR calc Af Amer: >60 mL/min (ref >60)
Glucose, Bld: 79 mg/dL (ref 65–99)
POTASSIUM: 3.5 mmol/L (ref 3.5–5.1)
Sodium: 134 mmol/L — ABNORMAL LOW (ref 135–145)
Total Protein: 7.4 g/dL (ref 6.5–8.1)

## 2017-05-22 LAB — POC URINE PREG, ED: Preg Test, Ur: POSITIVE — AB

## 2017-05-22 LAB — WET PREP, GENITAL
Clue Cells Wet Prep HPF POC: NONE SEEN
Sperm: NONE SEEN
Trich, Wet Prep: NONE SEEN
Yeast Wet Prep HPF POC: NONE SEEN

## 2017-05-22 LAB — HCG, QUANTITATIVE, PREGNANCY: HCG, BETA CHAIN, QUANT, S: 127680 m[IU]/mL — AB (ref <5)

## 2017-05-22 MED ORDER — RHO D IMMUNE GLOBULIN 1500 UNIT/2ML IJ SOSY
300.0000 ug | PREFILLED_SYRINGE | Freq: Once | INTRAMUSCULAR | Status: AC
  Administered 2017-05-22: 300 ug via INTRAMUSCULAR

## 2017-05-22 NOTE — ED Notes (Signed)
Pt understood dc material. NAD Noted 

## 2017-05-22 NOTE — ED Provider Notes (Signed)
[facility]-EMERGENCY DEPT Provider Note   CSN: [id number] Arrival date & time: 05/22/17  8295     History   Chief Complaint Chief Complaint  Patient presents with  . Headache  . Abdominal Pain    HPI [name] is a 31 y.o. female.  HPI [name] is a 31 y.o. female presents to emergency department complaining of lower abdominal cramping. Patient states that she believes she may be pregnant. She states that she had 3 positive pregnancy tests at home and was seen at the pregnancy Center 1 week ago, had positive pregnancy test they are according to her, had ultrasound which did not show any evidence of pregnancy at that time. Patient states her last menstrual cycle was July 25. This is her first pregnancy. She denies any vaginal discharge or bleeding. She states pain started yesterday. She reports pain feeling like a "bad period cramps." She reports pain is bilateral, over lower abdomen. Denies any nausea or vomiting. No fever or chills. No urinary symptoms. No changes in bowels.  Past Medical History:  Diagnosis Date  . Arthritis    rt shoulder  . Asthma   . Biliary colic   . DJD of shoulder    right  . GERD (gastroesophageal reflux disease)   . Headache   . Hernia of abdominal wall   . Obesity   . Polycystic ovarian syndrome   . Sleep apnea    sleep study 2009, mod sleep apnea, no CPAP  . Wears glasses     Patient Active Problem List   Diagnosis Date Noted  . Morbid obesity (HCC) 05/19/2017  . Migraine 05/19/2017  . Impingement syndrome of right shoulder 11/26/2014  . AC (acromioclavicular) arthritis 11/26/2014  . S/P arthroscopy of shoulder 11/26/2014    Past Surgical History:  Procedure Laterality Date  . CHOLECYSTECTOMY N/A 06/23/2016   Procedure: LAPAROSCOPIC CHOLECYSTECTOMY;  Surgeon: Abigail Miyamoto, MD;  Location: [facility] OR;  Service: General;  Laterality: N/A;  . COLONOSCOPY WITH ESOPHAGOGASTRODUODENOSCOPY (EGD)    . SHOULDER SURGERY     right  . WISDOM  TOOTH EXTRACTION      OB History    Gravida Para Term Preterm AB Living   2             SAB TAB Ectopic Multiple Live Births                   Home Medications    Prior to Admission medications   Medication Sig Start Date End Date Taking? Authorizing Provider  albuterol (PROVENTIL HFA;VENTOLIN HFA) 108 (90 BASE) MCG/ACT inhaler Inhale into the lungs every 6 (six) hours as needed for wheezing or shortness of breath.    [provider]  levonorgestrel-ethinyl estradiol (AVIANE,ALESSE,LESSINA) 0.1-20 MG-MCG tablet Take 1 tablet by mouth daily.    [provider]  meclizine (ANTIVERT) 12.5 MG tablet Take 2 tablets (25 mg total) by mouth 3 (three) times daily as needed for dizziness. 02/16/17   McDonald, Mia A, PA-C  nortriptyline (PAMELOR) 25 MG capsule Take 100 mg by mouth at bedtime.    [provider]  ondansetron (ZOFRAN ODT) 4 MG disintegrating tablet Take 1 tablet (4 mg total) by mouth every 8 (eight) hours as needed for nausea or vomiting. 03/09/17   McDonald, Mia A, PA-C  oxyCODONE-acetaminophen (PERCOCET/ROXICET) 5-325 MG tablet Take by mouth every 4 (four) hours as needed for severe pain.    [provider]  promethazine (PHENERGAN) 12.5 MG tablet Take 12.5  mg by mouth every 6 (six) hours as needed for nausea or vomiting.    [provider]  promethazine (PHENERGAN) 25 MG tablet Take 25 mg by mouth every 6 (six) hours as needed for nausea or vomiting.    [provider]  propranolol ER (INDERAL LA) 60 MG 24 hr capsule Take 60 mg by mouth daily.    [provider]  SUMAtriptan (IMITREX) 100 MG tablet Take 100 mg by mouth daily as needed for migraine. 02/03/17   [provider]  topiramate (TOPAMAX) 100 MG tablet Take 100 mg by mouth 2 (two) times daily. 02/02/17   [provider]    Family History Family History  Problem Relation Age of Onset  . Hypertension Mother   . Rheum arthritis Mother   . Rheum  arthritis Other   . Congestive Heart Failure Other     Social History Social History  Substance Use Topics  . Smoking status: Never Smoker  . Smokeless tobacco: Never Used  . Alcohol use Yes     Comment: social     Allergies   Azithromycin; Codeine; and Toradol [ketorolac tromethamine]   Review of Systems Review of Systems  Constitutional: Negative for chills and fever.  Respiratory: Negative for cough, chest tightness and shortness of breath.   Cardiovascular: Negative for chest pain, palpitations and leg swelling.  Gastrointestinal: Positive for abdominal pain. Negative for diarrhea, nausea and vomiting.  Genitourinary: Positive for pelvic pain. Negative for dysuria, flank pain, vaginal bleeding, vaginal discharge and vaginal pain.  Musculoskeletal: Negative for arthralgias, myalgias, neck pain and neck stiffness.  Skin: Negative for rash.  Neurological: Negative for dizziness, weakness and headaches.  All other systems reviewed and are negative.    Physical Exam Updated Vital Signs BP (!) 136/97 (BP Location: Right Arm)   Pulse 79   Temp 98.3 F (36.8 C) (Oral)   Resp 14   Ht  (1.6 m)   Wt 117.9 kg (260 lb)   LMP 03/23/2017 (Exact Date)   SpO2 100%   BMI 46.06 kg/m   Physical Exam  Constitutional: She is oriented to person, place, and time. She appears well-developed and well-nourished. No distress.  HENT:  Head: Normocephalic.  Eyes: Conjunctivae are normal.  Neck: Neck supple.  Cardiovascular: Normal rate, regular rhythm and normal heart sounds.   Pulmonary/Chest: Effort normal and breath sounds normal. No respiratory distress. She has no wheezes. She has no rales.  Abdominal: Soft. Bowel sounds are normal. She exhibits no distension. There is tenderness. There is no rebound.  Suprapubic tenderness  Genitourinary:  Genitourinary Comments: Normal external genitalia. Cervix is closed, there is mild bright red blood in vaginal canal and around cervix.  Uterus is gravid, mild cervical motion tenderness, uterine tenderness and bilateral adnexal tenderness.  Musculoskeletal: She exhibits no edema.  Neurological: She is alert and oriented to person, place, and time.  Skin: Skin is warm and dry.  Psychiatric: She has a normal mood and affect. Her behavior is normal.  Nursing note and vitals reviewed.    ED Treatments / Results  Labs (all labs ordered are listed, but only abnormal results are displayed) Labs Reviewed  WET PREP, GENITAL - Abnormal; Notable for the following:       Result Value   WBC, Wet Prep HPF POC FEW (*)    All other components within normal limits  COMPREHENSIVE METABOLIC PANEL - Abnormal; Notable for the following:    Sodium 134 (*)    CO2  21 (*)    BUN 5 (*)    ALT 12 (*)    All other components within normal limits  URINALYSIS, ROUTINE W REFLEX MICROSCOPIC - Abnormal; Notable for the following:    APPearance HAZY (*)    Hgb urine dipstick MODERATE (*)    Ketones, ur 5 (*)    Leukocytes, UA SMALL (*)    Bacteria, UA RARE (*)    Squamous Epithelial / LPF 0-5 (*)    All other components within normal limits  HCG, QUANTITATIVE, PREGNANCY - Abnormal; Notable for the following:    hCG, Beta Chain, Quant, S 161,096 (*)    All other components within normal limits  POC URINE PREG, ED - Abnormal; Notable for the following:    Preg Test, Ur POSITIVE (*)    All other components within normal limits  CBC  ABO/RH  RH IG WORKUP (INCLUDES ABO/RH)  GC/CHLAMYDIA PROBE AMP ([place]) NOT AT [facility]    EKG  EKG Interpretation None       Radiology US Ob Comp < 14 Wks  Result Date: 05/22/2017 CLINICAL DATA:  Pelvic pain. Patient is 8 weeks and 4 days pregnant based on her last menstrual period. EXAM: OBSTETRIC <14 WK [place] AND TRANSVAGINAL OB US TECHNIQUE: Both transabdominal and transvaginal ultrasound examinations were performed for complete evaluation of the gestation as well as the maternal uterus, adnexal  regions, and pelvic cul-de-sac. Transvaginal technique was performed to assess early pregnancy. COMPARISON:  None. FINDINGS: Intrauterine gestational sac: Single Yolk sac:  Visualized. Embryo:  Visualized. Cardiac Activity: Visualized. Heart Rate: 171  bpm CRL:  19.9  mm   8 w   4 d                  [place] EDC: 12/28/2017 Subchorionic hemorrhage:  None visualized. Maternal uterus/adnexae: No uterine masses. Cervix unremarkable. Right ovary corpus luteal cyst. No right adnexal masses. Left ovary not visualized. No left adnexal masses. No pelvic free fluid. IMPRESSION: 1. Single live intrauterine pregnancy with a measured gestational age of [redacted] weeks and 4 days, concordant with the expected gestational age based on the patient's last menstrual period. No evidence of a pregnancy complication. No emergent maternal abnormality. Electronically Signed   By: Amie Portland M.D.   On: 05/22/2017 17:56   US Ob Transvaginal  Result Date: 05/22/2017 CLINICAL DATA:  Pelvic pain. Patient is 8 weeks and 4 days pregnant based on her last menstrual period. EXAM: OBSTETRIC <14 WK [place] AND TRANSVAGINAL OB US TECHNIQUE: Both transabdominal and transvaginal ultrasound examinations were performed for complete evaluation of the gestation as well as the maternal uterus, adnexal regions, and pelvic cul-de-sac. Transvaginal technique was performed to assess early pregnancy. COMPARISON:  None. FINDINGS: Intrauterine gestational sac: Single Yolk sac:  Visualized. Embryo:  Visualized. Cardiac Activity: Visualized. Heart Rate: 171  bpm CRL:  19.9  mm   8 w   4 d                  [place] EDC: 12/28/2017 Subchorionic hemorrhage:  None visualized. Maternal uterus/adnexae: No uterine masses. Cervix unremarkable. Right ovary corpus luteal cyst. No right adnexal masses. Left ovary not visualized. No left adnexal masses. No pelvic free fluid. IMPRESSION: 1. Single live intrauterine pregnancy with a measured gestational age of [redacted] weeks and 4 days, concordant with  the expected gestational age based on the patient's last menstrual period. No evidence of a pregnancy complication. No emergent maternal abnormality. Electronically Signed  By: Amie Portland M.D.   On: 05/22/2017 17:56    Procedures Procedures (including critical care time)  Medications Ordered in ED Medications - No data to display   Initial Impression / Assessment and Plan / ED Course  I have reviewed the triage vital signs and the nursing notes.  Pertinent labs & imaging results that were available during my care of the patient were reviewed by me and considered in my medical decision making (see chart for details).     Patient with apparently positive pregnancy test at home and at [facility]. and ultrasound with no embryo visualized one week ago. She is now having new lower abdominal pain. Symptoms are concerning for possible ectopic pregnancy. We will get pregnancy test, labs, perform pelvic exam. Is positive pregnancy test here, will get ultrasound to rule out ectopic. Estimated [redacted] wks pregnant today, should be able to visualize pregnancy at this time.   5:32 PM Patient is having mild vaginal spotting on exam. Her ABO/Rh came back as O neg. I will order rhogam. [place] pending.   7:19 PM Ultrasound showing a normal intrauterine pregnancy estimated at 8 weeks 4 days. No signs of ectopic pregnancy. Patient received a rhogam. Discussed that she is at risk for possible miscarriage given bleeding and pain. I will have her follow-up with OB/GYN closely. Discussed pelvic rest. Discussed return precautions.   Vitals:   05/22/17 1645 05/22/17 1700 05/22/17 1730 05/22/17 1900  BP: 123/87 123/83 133/82 (!) 143/86  Pulse: 84 86 79 90  Resp: 17 19 (!) 22 (!) 21  Temp:      TempSrc:      SpO2: 100% 100% 100% 100%  Weight:      Height:        Final Clinical Impressions(s) / ED Diagnoses   Final diagnoses:  Abdominal pain in pregnancy, first trimester    New Prescriptions New Prescriptions     No medications on file        Jaynie Crumble, Cordelia Poche 05/22/17 Kathaleen Maser, MD 05/22/17 [phone]

## 2017-05-22 NOTE — ED Notes (Signed)
Waiting for sign off to give the medication from pharmacy

## 2017-05-22 NOTE — Discharge Instructions (Signed)
Your ultrasound showed normal intrauterine pregnancy estimated 8 weeks and 4 days. Because of abdominal cramping and mild vaginal spotting, you are at risk of possible miscarriage. Maintain pelvic rest. Continue to take prenatal vitamins daily. Tylenol for any cramping. Follow-up with OB/GYN. Go to the [facility] if increased pain, bleeding, any new concerning symptoms

## 2017-05-22 NOTE — ED Triage Notes (Signed)
Pt is here with headache and then lower abdominal cramping.  Pt states she has had 3 positive pregnancy test and now with lower abdominal cramping but not vaginal bleeding.

## 2017-05-23 LAB — GC/CHLAMYDIA PROBE AMP (~~LOC~~) NOT AT ARMC
CHLAMYDIA, DNA PROBE: NEGATIVE
NEISSERIA GONORRHEA: NEGATIVE

## 2017-05-23 LAB — RH IG WORKUP (INCLUDES ABO/RH)
ABO/RH(D): O NEG
ANTIBODY SCREEN: NEGATIVE
Unit division: 0

## 2017-06-16 ENCOUNTER — Encounter: Payer: Self-pay | Admitting: Neurology

## 2017-06-16 ENCOUNTER — Ambulatory Visit (INDEPENDENT_AMBULATORY_CARE_PROVIDER_SITE_OTHER): Payer: 59 | Admitting: Neurology

## 2017-06-16 VITALS — BP 138/90 | HR 89 | Ht 63.0 in | Wt 263.0 lb

## 2017-06-16 DIAGNOSIS — R51 Headache: Secondary | ICD-10-CM

## 2017-06-16 DIAGNOSIS — G4763 Sleep related bruxism: Secondary | ICD-10-CM

## 2017-06-16 DIAGNOSIS — Z6841 Body Mass Index (BMI) 40.0 and over, adult: Secondary | ICD-10-CM

## 2017-06-16 DIAGNOSIS — G43011 Migraine without aura, intractable, with status migrainosus: Secondary | ICD-10-CM | POA: Diagnosis not present

## 2017-06-16 DIAGNOSIS — E662 Morbid (severe) obesity with alveolar hypoventilation: Secondary | ICD-10-CM | POA: Diagnosis not present

## 2017-06-16 DIAGNOSIS — R519 Headache, unspecified: Secondary | ICD-10-CM

## 2017-06-16 NOTE — Progress Notes (Addendum)
SLEEP MEDICINE CLINIC   Provider:  Melvyn Novas, M D  Primary Care Physician:  Salli Real, MD   Referring Provider: Naomie Dean, MD    Chief Complaint  Patient presents with  . New Patient (Initial Visit)    pt alone, rm 10. in may started having bad headaches, sept apt with Lucia Gaskins and she felt it could be sleep r/t. in 2006 sleep study was done and showed she had sleep apnea. CPAP was ordered at that time but patient never really used it.     HPI:  [name] is a 31 y.o. female , seen here as in a referral from Dr. Lucia Gaskins for a sleep medicine evaluation.  [name] is a 31 year old Caucasian female, right-handed presenting today upon request of her local headache specialist, Dr. Lucia Gaskins. The patient struggles with obesity, frequent migraines, but has no family history of migraines. One port and she is waking up with her headaches and at times the headaches haven't woken her out of sleep. She sensed a severe pressure with a sharp stabbing sensation behind the eye. headaches typically get worse throughout the day. Out of the last week she had a headache on 3-4 days. [name] had been evaluated for the presence of sleep apnea in 2006 and at times was diagnosed, placed on CPAP, but found herself unable to use it. In 2006 no headache history was present. Her headaches begun only in May of this year. She tried to wean herself off birth control pills but after a month found no improvement in headaches. Headaches have affected her productivity at work she is sensitive to light, to sound, and movement. She sometimes feels confused and nauseated. She even experiences difficulty speaking fluently, and  finding words.   Chief complaint according to patient: The patient does not feel that she is sleepier than others, she does not excessively struggled to stay awake during the day, she endorsed the fatigue severity score at 22 points and the Epworth sleepiness score at only 4 points. She knows that she  snores.  Sleep habits are as follows: After a day of work she usually relaxes on her couch watching TV and falling asleep. She may have been asleep for an hour before she goes to her bedroom. Around 11:00 she is usually in her bedroom which is cool, quiet and dark and conducive to sleep. She sometimes struggles to fall asleep again once treatment of the bedroom. When she sleeps she prefers to sleep on her side or prone she uses only one pillow. Some nights she will dreamed vividly lucidly but this is not a frequent occurrence. She does not sleep walk sleep talk to her knowledge. She usually wakes up once or twice at night to go to the bathroom. The first bathroom break is usually at 3 AM. She usually wakes up before her alarm rings, spontaneously at 8:30.   Sleep medical history and family sleep history: no family members carrying a diagnosis of OSA, maternal family history of cancer in a maternal aunt and uncle, congestive heart failure in maternal grandmother, hypertension in her mother, as well as rheumatoid arthritis.  The patient did not undergo tonsillectomy she has no history of neck ENT or brain trauma, she was not a sleep walker and did not suffer from night terrors or enuresis in childhood.   Social history: raised by biological mother, doesn't know her father.  single- lives with a room mate- she now has to arise at 5:30 in the  morning for work, has to be at the workplace at 7 AM and works until [employer]:45 PM. She will return home by about 5 PM. typically cooks at home.  No tobacco use, occasionally drinks alcohol,  mostly wine, not exceeding 3 drinks. She drinks coffee 1-2 cups in AM> sodas or tea ( iced tea)  Every other day.    Dr Trevor MaceAhern's note  04-2017: Referring Provider: [facility],  Salli RealSun, Yun, MD Primary Care Physician:  Salli RealSun, Yun, MD CC:  Headaches  HPI:  [name] is a 31 y.o. female here as a referral from Dr. Eli Phillipsenter for for headaches. Medical history of  hyperlipidemia, obesity, migraine, mild intermittent asthma. She has recently stopped taking all her medication because of a positive pregnancy test. Migraines started in her teens but very rare, before 4 months ago she has had 2 migraines in her life, no FHx of migraines. 4 months the migraines became worse, more frequent, in the forehead and behind the eyes, she was having them daily. She is waking up with the headaches. They are more pressure, severe pressure, sometimes throbbing, sometimes she can feel heartbeat in her head, worse positionally, she has loud noise in her ears, no aura but she has spots that come and go, had a normal dilated eye exam recently. She has not had a lumbar puncture. No inciting events. It has been keeping her from work. She has 20 headache days a month. She gets light, sound and movement sensitivity, nausea, can cause confusion and she can't make her words come out of her mouth. She has neck pain, muscular and tight. The headaches can last days. On average 6/10 and can be 10/10 and severe, vomiting as well. Medications have not helped. No medication overuse.  Dr. Lucia GaskinsAhern:  Reviewed notes, labs and imaging from outside physicians, which showed: Personally reviewed MRI of the brain images which were normal. Labs drawn include magnesium which was normal, CMP showed low sodium 135, chloride 108, CO2 19, anion 8, creatinine 1.2 BUN 12 these were taken channel 17th 2018, CBC was unremarkable LDL 142, TSH normal.  Reviewed previous history. Patient has a history of migraines. She has been on multiple medications including Zofran, topiramate, sumatriptan oral, oxycodone, nortriptyline, propranolol. Exam was normal including neurologic exam. When patient was seen she had a severe headache, pulsating with nausea and vomiting worse in the mornings with concern for intracranial hypertension in the setting of being overweight and on birth control. CT June 2018 showed dural calcification. The  classification by history is migraine headache and chronic daily headaches, nausea, vomiting, photophobia, phonophobia, aura, numbness and tingling in the left periorbital area, left frontal area, left temporal area also can be on the right in these areas with radiation, sharp, dull aching and throbbing, lasting for days and occurring daily. Symptoms are exacerbated by noise, movement, leg, stress, smells and odors sleep deprivation. Relieved by rest ice opioids and migraine medications. Associated vertigo, confusion tenderness. Having difficulty with activities of daily living.    Review of Systems: Out of a complete 14 system review, the patient complains of only the following symptoms, and all other reviewed systems are negative.  I reviewed the patient's medication her review of systems, past medical history and past surgical history. She takes an albuterol inhaler, takes Phenergan when necessary for nausea and an RDI.The patient does not feel that she is sleepier than others, she does not excessively struggled to stay awake during the day, she endorsed the fatigue severity  score at 22 points and the Epworth sleepiness score at only 4 points. She knows that she snores.     Social History   Social History  . Marital status: Married    Spouse name: N/A  . Number of children: N/A  . Years of education: N/A   Occupational History  . Not on file.   Social History Main Topics  . Smoking status: Never Smoker  . Smokeless tobacco: Never Used  . Alcohol use Yes     Comment: social  . Drug use: No  . Sexual activity: Yes   Other Topics Concern  . Not on file   Social History Narrative  . No narrative on file    Family History  Problem Relation Age of Onset  . Hypertension Mother   . Rheum arthritis Mother   . Rheum arthritis Other   . Congestive Heart Failure Other     Past Medical History:  Diagnosis Date  . Arthritis    rt shoulder  . Asthma   . Biliary colic   . DJD  of shoulder    right  . GERD (gastroesophageal reflux disease)   . Headache   . Hernia of abdominal wall   . Obesity   . Polycystic ovarian syndrome   . Sleep apnea    sleep study 2009, mod sleep apnea, no CPAP  . Wears glasses     Past Surgical History:  Procedure Laterality Date  . CHOLECYSTECTOMY N/A 06/23/2016   Procedure: LAPAROSCOPIC CHOLECYSTECTOMY;  Surgeon: Abigail Miyamoto, MD;  Location: [facility] OR;  Service: General;  Laterality: N/A;  . COLONOSCOPY WITH ESOPHAGOGASTRODUODENOSCOPY (EGD)    . SHOULDER SURGERY     right  . WISDOM TOOTH EXTRACTION      Current Outpatient Prescriptions  Medication Sig Dispense Refill  . albuterol (PROVENTIL HFA;VENTOLIN HFA) 108 (90 BASE) MCG/ACT inhaler Inhale into the lungs every 6 (six) hours as needed for wheezing or shortness of breath.    . levonorgestrel-ethinyl estradiol (AVIANE,ALESSE,LESSINA) 0.1-20 MG-MCG tablet Take 1 tablet by mouth daily.    . ondansetron (ZOFRAN ODT) 4 MG disintegrating tablet Take 1 tablet (4 mg total) by mouth every 8 (eight) hours as needed for nausea or vomiting. 20 tablet 0  . promethazine (PHENERGAN) 25 MG tablet Take 25 mg by mouth every 6 (six) hours as needed for nausea or vomiting.     No current facility-administered medications for this visit.     Allergies as of 06/16/2017 - Review Complete 06/16/2017  Allergen Reaction Noted  . Azithromycin Other (See Comments) 09/07/2013  . Codeine Hives 09/07/2013  . Toradol [ketorolac tromethamine] Rash 02/16/2017    Vitals: BP 138/90   Pulse 89   Ht 5\' 3"  (1.6 m)   Wt 263 lb (119.3 kg)   LMP 03/23/2017 (Exact Date)   BMI 46.59 kg/m  Last Weight:  Wt Readings from Last 1 Encounters:  06/16/17 263 lb (119.3 kg)   WUJ:WJXB mass index is 46.59 kg/m.     Last Height:   Ht Readings from Last 1 Encounters:  06/16/17 5\' 3"  (1.6 m)    Physical exam:  General: The patient is awake, alert and appears not in acute distress. The patient is well  groomed. Head: Normocephalic, atraumatic. Neck is supple. Mallampati 4 ,  neck circumference:16.5 . Nasal airflow patent ,  Retrognathia is seen. No braces or retainers in place- used a mouth guard for bruxism.   Cardiovascular:  Regular rate and rhythm , without  murmurs  or carotid bruit, and without distended neck veins. Respiratory: Lungs are clear to auscultation. Skin:  Without evidence of edema, or rash Trunk: BMI is 47.  The patient's posture is erect  Neurologic exam : The patient is awake and alert, oriented to place and time.   Memory subjective described as intact.   Attention span & concentration ability appears normal.  Speech is fluent,  without  dysarthria, dysphonia or aphasia.  Mood and affect are appropriate.  Cranial nerves: Pupils are equal and briskly reactive to light. Funduscopic exam without evidence of pallor or edema. Extraocular movements  in vertical and horizontal planes intact and without nystagmus. Visual fields by finger perimetry are intact. Hearing to finger rub intact.  Facial sensation intact to fine touch. Facial motor strength is symmetric and tongue and uvula move midline. Shoulder shrug was symmetrical.   Motor exam:   Normal tone, muscle bulk and symmetric strength in all extremities. Sensory:  Fine touch, pinprick and vibration were tested in all extremities. Proprioception tested in the upper extremities was normal. Coordination: Rapid alternating movements in the fingers/hands was normal. Finger-to-nose maneuver  normal without evidence of ataxia, dysmetria or tremor. Gait and station: Patient walks without assistive device .Deep tendon reflexes: in the  upper and lower extremities are symmetric and intact.    Assessment:  After physical and neurologic examination, review of laboratory studies,  Personal review of imaging studies, reports of other /same  Imaging studies, results of polysomnography and / or neurophysiology testing and pre-existing  records as far as provided in visit., my assessment is   1) I believe that Mrs. Lanzo has many risk factors for the presence of obstructive sleep apnea including her BMI, that she was tested positive before, neck circumference which exceed 16 inches and high-grade Mallampati. She does not have comorbidities except for the morning headaches which can be related to hypoventilation causing hypoxemia and hypercapnia. She is known to snore. She is not excessively daytime sleepy. Attended SPLIT sleep test to see if apnea is present, which than can be followed by a CPAP titration if needed.  A dental device can be used for the treatment of mild sleep apnea non-REM sleep related sleep apnea and sleep apnea not associated with hypoxemia. Given that her migraines are relatively new in onset, and high in severity, the presence of obesity hypoventilation has to be ruled out. In addition some of her headaches have had cluster character. These kind of headaches usually arise out of slow-wave sleep, but I would be unable to determine this part of the sleep architecture in an unattended sleep study.  The patient was advised of the nature of the diagnosed disorder , the treatment options and the  risks for general health and wellness arising from not treating the condition.   I spent more than 45 minutes of face to face time with the patient. Greater than 50% of time was spent in counseling and coordination of care. We have discussed the diagnosis and differential and I answered the patient's questions.    Plan:  Treatment plan and additional workup :  HST , follow with CPAP if needed, RV after 3 month.   Melvyn Novas, MD 06/16/2017, 11:18 AM  Certified in Neurology by ABPN Certified in Sleep Medicine by [facility] [address] [place], [place] [address]

## 2017-06-27 ENCOUNTER — Telehealth: Payer: Self-pay | Admitting: Neurology

## 2017-06-27 NOTE — Telephone Encounter (Signed)
Pt has called stating that she was told by Dr Lucia GaskinsAhern that re: her head aches when she has them she may need to go to the hospital or try to see if on that same day she can be seen by Dr Lucia GaskinsAhern.  Please call pt to let her know if it is possible to be seen today by Dr Lucia GaskinsAhern

## 2017-06-27 NOTE — Telephone Encounter (Signed)
I called the patient back. She reports a headache but no neurological changes or nausea. I spoke with Dr. Lucia GaskinsAhern and she is offering an infusion for Migraine. I spoke with the infusion suite to make them aware. Patient coming at 10:15 for infusion. She has allergies to Toradol, Azithromycin, and Codeine. She will be driving herself. Order signed and given to infusion RN.

## 2017-07-12 ENCOUNTER — Telehealth: Payer: Self-pay

## 2017-07-12 DIAGNOSIS — R519 Headache, unspecified: Secondary | ICD-10-CM

## 2017-07-12 DIAGNOSIS — G43101 Migraine with aura, not intractable, with status migrainosus: Secondary | ICD-10-CM

## 2017-07-12 DIAGNOSIS — R51 Headache: Secondary | ICD-10-CM

## 2017-07-12 NOTE — Telephone Encounter (Signed)
I have already ordered HST

## 2017-07-12 NOTE — Telephone Encounter (Signed)
[facility]. In-lab denied. Need order for HST

## 2017-08-03 ENCOUNTER — Telehealth: Payer: Self-pay | Admitting: Neurology

## 2017-08-03 NOTE — Telephone Encounter (Signed)
Patient has had a headache x 3 days and would like to come in for an infusion.

## 2017-08-03 NOTE — Telephone Encounter (Signed)
Called the patient, advised her medication options limited while pregnant. She informed me that she is no longer pregnant. I let her know that I would d/w Dr. Lucia GaskinsAhern for possible infusion tomorrow and would call back either before close today or tomorrow morning. She is aware that if headache is bad enough or any associated symptoms she should go to the ED.

## 2017-08-04 NOTE — Telephone Encounter (Addendum)
Order sheet for infusion given to Dr. Lucia GaskinsAhern for completion and signature.

## 2017-08-04 NOTE — Telephone Encounter (Signed)
She can get an infusion, talk to infusion and see if we can get her in here

## 2017-08-04 NOTE — Telephone Encounter (Signed)
Spoke with patient and infusion RN. Patient will come to office for infusion at 4:00 pm. Reviewed allergies with patient. She denies any nausea at this time. She will not have a driver for the appointment.

## 2017-08-12 ENCOUNTER — Telehealth: Payer: Self-pay | Admitting: Neurology

## 2017-08-12 NOTE — Telephone Encounter (Signed)
We have attempted to call the patient 2 times to schedule sleep study. Patient has been unavailable at the phone numbers we have on file and has not returned our calls. At this point we will send a letter asking pt to please contact the sleep lab to schedule their sleep study. If patient calls back we will schedule them for their sleep study. ° °

## 2017-08-15 ENCOUNTER — Telehealth: Payer: Self-pay | Admitting: Neurology

## 2017-08-15 NOTE — Telephone Encounter (Signed)
Patient has had a migraine for 3 days and would like to come in for an infusion.

## 2017-08-15 NOTE — Telephone Encounter (Signed)
Called and pt states this is her typical migraine. She is not taking medications currently. Spoke with Dr. Lucia GaskinsAhern. Patient can come in for infusion. Also, pt scheduled for f/u visit per Dr. Lucia GaskinsAhern tomorrow 12/18 @ 4 pm with 3:30 arrival time. Patient verbalized understanding and appreciation. She will not have a driver today for the infusion. She is allergic to Toradol. Order form for infusion filled out and signed by Dr. Lucia GaskinsAhern.

## 2017-08-16 ENCOUNTER — Ambulatory Visit (INDEPENDENT_AMBULATORY_CARE_PROVIDER_SITE_OTHER): Payer: 59 | Admitting: Neurology

## 2017-08-16 ENCOUNTER — Encounter: Payer: Self-pay | Admitting: Neurology

## 2017-08-16 VITALS — BP 145/95 | HR 89 | Ht 63.0 in | Wt 280.0 lb

## 2017-08-16 DIAGNOSIS — G43709 Chronic migraine without aura, not intractable, without status migrainosus: Secondary | ICD-10-CM | POA: Diagnosis not present

## 2017-08-16 MED ORDER — TOPIRAMATE ER 100 MG PO SPRINKLE CAP24: 100.0000 mg | EXTENDED_RELEASE_CAPSULE | Freq: Every day | ORAL | 11 refills | Status: DC

## 2017-08-16 MED ORDER — FREMANEZUMAB-VFRM 225 MG/1.5ML ~~LOC~~ SOSY: 225.0000 mg | PREFILLED_SYRINGE | SUBCUTANEOUS | 11 refills | Status: DC

## 2017-08-16 NOTE — Progress Notes (Signed)
[name]    Provider:  Dr Lucia Gaskins Referring Provider: Salli Real, MD Primary Care Physician:  Salli Real, MD    Interval history 08/16/2017: Patient returns for follow up on migraines. She has 20 headache days a month. Most are migrainous. She has been to the office twice for infusions. Discussed many options, other medications we can use, Ajovy/Aimovig, Botox. Also discussed differential including IIH she declines LP. Will treat with Topiramate and try Ajovy. Will take her to schedule her sleep test as well.    HPI: [name] is a 31 y.o. female here as a referral from Dr. Eli Phillips for  for headaches. Medical history of hyperlipidemia, obesity, migraine, mild intermittent asthma. She has recently stopped taking all her medication because of a positive pregnancy test. Migraines started in her teens but very rare, before 4 months ago she has had 2 migraines in her life, no FHx of migraines. 4 months the migraines became worse, more frequent, in the forehead and behind the eyes, she was having them daily. She is waking up with the headaches. They are more pressure, severe pressure, sometimes throbbing, sometimes she can feel heartbeat in her head, worse positionally, she has loud noise in her ears, no aura but she has spots that come and go, had a normal dilated eye exam recently. She has not had a lumbar puncture. No inciting events. It has been keeping her from work. She has 20 headache days a month. She gets light, sound and movement sensitivity, nausea, can cause confusion and she can't make her words come out of her mouth. She has neck pain, muscular and tight. The headaches can last days. On average 6/10 and can be 10/10 and severe, vomiting as well. Medications have not helped. No medication overuse.   Reviewed notes, labs and imaging from outside physicians, which showed:  Personally reviewed MRI of the brain images which were normal.  Labs drawn include magnesium which  was normal, CMP showed low sodium 135, chloride 108, CO2 19, anion 8, creatinine 1.2 BUN 12 these were taken channel 17th 2018, CBC was unremarkable LDL 142, TSH normal.  Reviewed previous history. Patient has a history of migraines. She has been on multiple medications including Zofran, topiramate, sumatriptan oral, oxycodone, nortriptyline, propranolol. Exam was normal including neurologic exam. When patient was seen she had a severe headache, pulsating with nausea and vomiting worse in the mornings with concern for intracranial hypertension in the setting of being overweight and on birth control. CT June 2018 showed dural calcification. The classification by history is migraine headache and chronic daily headaches, nausea, vomiting, photophobia, phonophobia, aura, numbness and tingling in the left periorbital area, left frontal area, left temporal area also can be on the right in these areas with radiation, sharp, dull aching and throbbing, lasting for days and occurring daily. Symptoms are exacerbated by noise, movement, leg, stress, smells and odors sleep deprivation. Relieved by rest ice opioids and migraine medications. Associated vertigo, confusion tenderness. Having difficulty with activities of daily living.   Social History   Socioeconomic History  . Marital status: Single    Spouse name: Not on file  . Number of children: 0  . Years of education: Not on file  . Highest education level: Some college, no degree  Social Needs  . Financial resource strain: Not on file  . Food insecurity - worry: Not on file  . Food insecurity - inability: Not on file  . Transportation needs - medical: Not on file  .  Transportation needs - non-medical: Not on file  Occupational History  . Not on file  Tobacco Use  . Smoking status: Never Smoker  . Smokeless tobacco: Never Used  Substance and Sexual Activity  . Alcohol use: Yes    Comment: social  . Drug use: No  . Sexual activity: Yes  Other  Topics Concern  . Not on file  Social History Narrative   Lives in an apt w/ a roommate   Right handed   Drinks 0-2 cups of caffeine daily    Family History  Problem Relation Age of Onset  . Hypertension Mother   . Rheum arthritis Mother   . Rheum arthritis Other   . Congestive Heart Failure Other     Past Medical History:  Diagnosis Date  . Arthritis    rt shoulder  . Asthma   . Biliary colic   . DJD of shoulder    right  . GERD (gastroesophageal reflux disease)   . Headache   . Hernia of abdominal wall   . Obesity   . Polycystic ovarian syndrome   . Sleep apnea    sleep study 2009, mod sleep apnea, no CPAP  . Wears glasses     Past Surgical History:  Procedure Laterality Date  . CHOLECYSTECTOMY N/A 06/23/2016   Procedure: LAPAROSCOPIC CHOLECYSTECTOMY;  Surgeon: Abigail Miyamotoouglas Blackman, MD;  Location: [facility] OR;  Service: General;  Laterality: N/A;  . COLONOSCOPY WITH ESOPHAGOGASTRODUODENOSCOPY (EGD)    . SHOULDER SURGERY     right  . WISDOM TOOTH EXTRACTION      Current Outpatient Medications  Medication Sig Dispense Refill  . albuterol (PROVENTIL HFA;VENTOLIN HFA) 108 (90 BASE) MCG/ACT inhaler Inhale into the lungs every 6 (six) hours as needed for wheezing or shortness of breath.    . levonorgestrel-ethinyl estradiol (AVIANE,ALESSE,LESSINA) 0.1-20 MG-MCG tablet Take 1 tablet by mouth daily.    . ondansetron (ZOFRAN ODT) 4 MG disintegrating tablet Take 1 tablet (4 mg total) by mouth every 8 (eight) hours as needed for nausea or vomiting. 20 tablet 0  . promethazine (PHENERGAN) 25 MG tablet Take 25 mg by mouth every 6 (six) hours as needed for nausea or vomiting.     No current facility-administered medications for this visit.     Allergies as of 08/16/2017 - Review Complete 08/16/2017  Allergen Reaction Noted  . Azithromycin Other (See Comments) 09/07/2013  . Codeine Hives 09/07/2013  . Toradol [ketorolac tromethamine] Rash 02/16/2017    Vitals: BP (!) 145/95 (BP  Location: Right Arm, Patient Position: Sitting)   Pulse 89   Ht 5\' 3"  (1.6 m)   Wt 280 lb (127 kg)   LMP 03/23/2017 (Exact Date)   BMI 49.60 kg/m  Last Weight:  Wt Readings from Last 1 Encounters:  08/16/17 280 lb (127 kg)   Last Height:   Ht Readings from Last 1 Encounters:  08/16/17 5\' 3"  (1.6 m)   Physical exam: Exam: Gen: NAD, conversant, well nourised, obese, well groomed                     CV: RRR, no MRG. No Carotid Bruits. No peripheral edema, warm, nontender Eyes: Conjunctivae clear without exudates or hemorrhage  Neuro: Detailed Neurologic Exam  Speech:    Speech is normal; fluent and spontaneous with normal comprehension.  Cognition:    The patient is oriented to person, place, and time;     recent and remote memory intact;     language fluent;  normal attention, concentration,     fund of knowledge Cranial Nerves:    The pupils are equal, round, and reactive to light. The fundi are normal and spontaneous venous pulsations are present. Visual fields are full to finger confrontation. Extraocular movements are intact. Trigeminal sensation is intact and the muscles of mastication are normal. The face is symmetric. The palate elevates in the midline. Hearing intact. Voice is normal. Shoulder shrug is normal. The tongue has normal motion without fasciculations.   Coordination:    Normal finger to nose and heel to shin. Normal rapid alternating movements.   Gait:    Heel-toe and tandem gait are normal.   Motor Observation:    No asymmetry, no atrophy, and no involuntary movements noted. Tone:    Normal muscle tone.    Posture:    Posture is normal. normal erect    Strength:    Strength is V/V in the upper and lower limbs.      Sensation: intact to LT     Reflex Exam:  DTR's:    Deep tendon reflexes in the upper and lower extremities are normal bilaterally.   Toes:    The toes are downgoing bilaterally.   Clonus:    Clonus is  absent.    Assessment/Plan:  31 year old morbidly obese female here for evaluation of headaches. Patient has a history of rare migraines, new headache started approximately 4 months ago however they're different in quality more pressure around the head, can hear heartbeat in her head, worse positionally, hearing changes. MRI of the brain was unremarkable.  Given positional quality of headache, pressure, hearing changes such as heartbeat in the ear, morbid obesity, I question whether there is some intracranial hypertension despite normal MRI. I suggest a lumbar puncturebut she declines, will start Qudexy. Will also trial Ajovy.  Sleep apnea diagnosed in the past, untreated with morning positional headaches, fatigue, morbid obesity: Patient needs sleep study: Pending patient to schedule.  Healthy weight and wellness center: Discussed morbid obesity, health risks including the association with idiopathic intracranial hypertension. Referred her. Highly encouraged her to go  Discussed intracranial idiopathic hypertension untreated can cause permanent vision loss, for any acute vision changes or directly to the emergency room. Also discussed untreated obstructive sleep apnea especially sequela of increased risk of stroke cardiovascular disease.  Discussed: To prevent or relieve headaches, try the following: Cool Compress. Lie down and place a cool compress on your head.  Avoid headache triggers. If certain foods or odors seem to have triggered your migraines in the past, avoid them. A headache diary might help you identify triggers.  Include physical activity in your daily routine. Try a daily walk or other moderate aerobic exercise.  Manage stress. Find healthy ways to cope with the stressors, such as delegating tasks on your to-do list.  Practice relaxation techniques. Try deep breathing, yoga, massage and visualization.  Eat regularly. Eating regularly scheduled meals and maintaining a healthy  diet might help prevent headaches. Also, drink plenty of fluids.  Follow a regular sleep schedule. Sleep deprivation might contribute to headaches Consider biofeedback. With this mind-body technique, you learn to control certain bodily functions - such as muscle tension, heart rate and blood pressure - to prevent headaches or reduce headache pain.    Proceed to emergency room if you experience new or worsening symptoms or symptoms do not resolve, if you have new neurologic symptoms or if headache is severe, or for any concerning symptom.   Provided education and documentation  from American headache Society toolbox including articles on: chronic migraine medication overuse headache, chronic migraines, prevention of migraines, behavioral and other nonpharmacologic treatments for headache.  Naomie Dean, MD  [facility] [address] [place], [place] [address]  Phone [phone] Fax [phone]  A total of 25 minutes was spent face-to-face with this patient. Over half this time was spent on counseling patient on the chronic migraine diagnosis and different diagnostic and therapeutic options available.

## 2017-08-16 NOTE — Patient Instructions (Signed)
Magnesium citrate 400mg  to 600mg  daily, riboflavin 400mg  daily, Coenzyme Q 10 100mg  three times daily OR Migrelief (on [employer])- Magnesium/ Vitamin B2 and feverfew Keep calendar Consider joining our Facebook group [facility].

## 2017-08-29 ENCOUNTER — Other Ambulatory Visit: Payer: Self-pay

## 2017-08-29 ENCOUNTER — Emergency Department (HOSPITAL_COMMUNITY)
Admission: EM | Admit: 2017-08-29 | Discharge: 2017-08-29 | Disposition: A | Discharge status: Home / Self Care | Payer: 59 | Attending: Emergency Medicine | Admitting: Emergency Medicine

## 2017-08-29 ENCOUNTER — Encounter (HOSPITAL_COMMUNITY): Payer: Self-pay | Admitting: *Deleted

## 2017-08-29 DIAGNOSIS — R51 Headache: Secondary | ICD-10-CM | POA: Insufficient documentation

## 2017-08-29 DIAGNOSIS — Z79899 Other long term (current) drug therapy: Secondary | ICD-10-CM | POA: Diagnosis not present

## 2017-08-29 DIAGNOSIS — J45909 Unspecified asthma, uncomplicated: Secondary | ICD-10-CM | POA: Insufficient documentation

## 2017-08-29 DIAGNOSIS — R519 Headache, unspecified: Secondary | ICD-10-CM

## 2017-08-29 LAB — POC URINE PREG, ED: Preg Test, Ur: NEGATIVE

## 2017-08-29 MED ORDER — METOCLOPRAMIDE HCL 5 MG PO TABS: 5.0000 mg | ORAL_TABLET | Freq: Three times a day (TID) | ORAL | 0 refills | Status: DC | PRN

## 2017-08-29 MED ORDER — DEXAMETHASONE SODIUM PHOSPHATE 10 MG/ML IJ SOLN
10.0000 mg | Freq: Once | INTRAMUSCULAR | Status: AC
  Administered 2017-08-29: 10 mg via INTRAMUSCULAR
  Filled 2017-08-29: qty 1

## 2017-08-29 MED ORDER — NAPROXEN 500 MG PO TABS
500.0000 mg | ORAL_TABLET | Freq: Once | ORAL | Status: AC
  Administered 2017-08-29: 500 mg via ORAL
  Filled 2017-08-29: qty 1

## 2017-08-29 NOTE — ED Provider Notes (Signed)
[facility] Provider Note   CSN: [id number] Arrival date & time: 08/29/17  [id number]     History   Chief Complaint Chief Complaint  Patient presents with  . Migraine    HPI [name] is a 31 y.o. female.  HPI 31 yo female with PMH of Asthma, PCOS, OSA, and diagnosis of recurred headaches in May 2018 who presents with HA for 4 days. She describes HA as frontal behind her eyes and throbbing in nature. She has associated photophobia and phonophobia. She is followed by neurology and was last seen in 08/16/17. Neurology thinks that her headaches may be due to idiopathic intracranial hypertension. Her MRI brain was normal and patient declined lumbar puncture. She is currrently of Qudexa and Ajovy. Her headaches are sometimes associated with intermittent blurred vision and vomiting but she denies these symptoms currently. Her headache started gradually. Denies fevers, chills, rhinorrhea, nasal congestion, cough. She has tried Ibuprofen and Tylenol without relief. No focal weakness, numbness, slurred speech, facial droop. No tobacco use. She reports she gets IV infusion of a medication (possibly depakote) at the neurologist which helps resolve the headache. Their office is closed today.   Past Medical History:  Diagnosis Date  . Arthritis    rt shoulder  . Asthma   . Biliary colic   . DJD of shoulder    right  . GERD (gastroesophageal reflux disease)   . Headache   . Hernia of abdominal wall   . Obesity   . Polycystic ovarian syndrome   . Sleep apnea    sleep study 2009, mod sleep apnea, no CPAP  . Wears glasses     Patient Active Problem List   Diagnosis Date Noted  . Morbid obesity (HCC) 05/19/2017  . Migraine 05/19/2017  . Impingement syndrome of right shoulder 11/26/2014  . AC (acromioclavicular) arthritis 11/26/2014  . S/P arthroscopy of shoulder 11/26/2014    Past Surgical History:  Procedure Laterality Date  . CHOLECYSTECTOMY N/A  06/23/2016   Procedure: LAPAROSCOPIC CHOLECYSTECTOMY;  Surgeon: Abigail Miyamotoouglas Blackman, MD;  Location: [facility] OR;  Service: General;  Laterality: N/A;  . COLONOSCOPY WITH ESOPHAGOGASTRODUODENOSCOPY (EGD)    . SHOULDER SURGERY     right  . WISDOM TOOTH EXTRACTION      OB History    Gravida Para Term Preterm AB Living   2             SAB TAB Ectopic Multiple Live Births                   Home Medications    Prior to Admission medications   Medication Sig Start Date End Date Taking? Authorizing Provider  albuterol (PROVENTIL HFA;VENTOLIN HFA) 108 (90 BASE) MCG/ACT inhaler Inhale into the lungs every 6 (six) hours as needed for wheezing or shortness of breath.    [provider]  Fremanezumab-vfrm (AJOVY) 225 MG/1.5ML SOSY Inject 225 mg into the skin every 30 (thirty) days. 08/16/17   Anson FretAhern, Antonia B, MD  levonorgestrel-ethinyl estradiol (AVIANE,ALESSE,LESSINA) 0.1-20 MG-MCG tablet Take 1 tablet by mouth daily.    [provider]  metoCLOPramide (REGLAN) 5 MG tablet Take 1 tablet (5 mg total) by mouth every 8 (eight) hours as needed for nausea. 08/29/17   Palma HolterGunadasa, Kanishka G, MD  ondansetron (ZOFRAN ODT) 4 MG disintegrating tablet Take 1 tablet (4 mg total) by mouth every 8 (eight) hours as needed for nausea or vomiting. 03/09/17   McDonald, Mia A, PA-C  promethazine (  PHENERGAN) 25 MG tablet Take 25 mg by mouth every 6 (six) hours as needed for nausea or vomiting.    [provider]  Topiramate ER (QUDEXY XR) 100 MG CS24 sprinkle capsule Take 100 mg by mouth at bedtime. 08/16/17   Anson FretAhern, Antonia B, MD    Family History Family History  Problem Relation Age of Onset  . Hypertension Mother   . Rheum arthritis Mother   . Rheum arthritis Other   . Congestive Heart Failure Other     Social History Social History   Tobacco Use  . Smoking status: Never Smoker  . Smokeless tobacco: Never Used  Substance Use Topics  . Alcohol use: Yes    Comment: social  . Drug  use: No     Allergies   Azithromycin; Codeine; and Toradol [ketorolac tromethamine]   Review of Systems Review of Systems  Constitutional: Negative for chills and fever.  HENT: Negative for congestion, rhinorrhea and sore throat.   Respiratory: Negative for cough and shortness of breath.   Cardiovascular: Negative for chest pain.  Gastrointestinal: Negative for abdominal pain, constipation, diarrhea and vomiting.  Neurological: Positive for headaches. Negative for dizziness, seizures, syncope, facial asymmetry, speech difficulty, weakness, light-headedness and numbness.  Psychiatric/Behavioral: Negative for confusion.     Physical Exam Updated Vital Signs BP 140/68 (BP Location: Right Arm)   Pulse 80   Temp 98 F (36.7 C) (Oral)   Resp 20   LMP 08/25/2017 (Approximate)   SpO2 99%   Physical Exam  Constitutional: She is oriented to person, place, and time. She appears well-developed and well-nourished. No distress.  HENT:  Head: Normocephalic and atraumatic.  Right Ear: External ear normal.  Left Ear: External ear normal.  Mouth/Throat: Oropharynx is clear and moist.  Eyes: Conjunctivae and EOM are normal. Pupils are equal, round, and reactive to light. Right eye exhibits no discharge. Left eye exhibits no discharge.  Neck: Normal range of motion. Neck supple.  Cardiovascular: Normal rate, regular rhythm and normal heart sounds.  Pulmonary/Chest: Effort normal and breath sounds normal. No stridor. No respiratory distress. She has no wheezes. She has no rales. She exhibits no tenderness.  Abdominal: Soft. Bowel sounds are normal. There is no tenderness.  Lymphadenopathy:    She has no cervical adenopathy.  Neurological: She is alert and oriented to person, place, and time. No cranial nerve deficit or sensory deficit. She exhibits normal muscle tone. Coordination normal.  Normal gait   Skin: Skin is warm. Capillary refill takes less than 2 seconds. She is not diaphoretic.    Psychiatric: She has a normal mood and affect. Her behavior is normal. Judgment and thought content normal.     ED Treatments / Results  Labs (all labs ordered are listed, but only abnormal results are displayed) Labs Reviewed  POC URINE PREG, ED    EKG  EKG Interpretation None       Radiology No results found.  Procedures Procedures (including critical care time)  Medications Ordered in ED Medications  dexamethasone (DECADRON) injection 10 mg (10 mg Intramuscular Given 08/29/17 1034)  naproxen (NAPROSYN) tablet 500 mg (500 mg Oral Given 08/29/17 1102)     Initial Impression / Assessment and Plan / ED Course  I have reviewed the triage vital signs and the nursing notes.  Pertinent labs & imaging results that were available during my care of the patient were reviewed by me and considered in my medical decision making (see chart for details).  31 yo female  with PMH of Asthma, PCOS, OSA, and diagnosis of recurred headaches in May 2018 that neurology thinks is possibly due to idiopathic intracranial hypertension. Patient is afebrile and neurological exam is normal. She is well appearing and non-toxic appearing. IV Decadron and Naproxen given. She is allergic to Toradol. She does not have anyone to drive her home. Therefore, provided Rx for Reglan PRN that she can take for her HA at home. Discussed return precautions. Follow up with neurology this week.   Final Clinical Impressions(s) / ED Diagnoses   Final diagnoses:  Intractable headache, unspecified chronicity pattern, unspecified headache type    ED Discharge Orders        Ordered    metoCLOPramide (REGLAN) 5 MG tablet  Every 8 hours PRN     08/29/17 1029       Palma Holter, MD 08/29/17 1731    Blane Ohara, MD 08/30/17 1101

## 2017-08-29 NOTE — Discharge Instructions (Signed)
We gave you a dose of Decadron by injection and Naproxen for your headache. You can take Reglan and Benadryl every 8 hours as needed for your headache at home; these medications make you drowsy so be careful. Please follow up with your neurologist as soon as possible.

## 2017-08-29 NOTE — ED Triage Notes (Signed)
To ED for eval of migraine for past 4 days. Pt sees [facility] and placed on new meds recently. Nausea, no vomiting. Pt states she has nobody to drive her home and does have phenergan at home to take.

## 2017-08-31 ENCOUNTER — Telehealth: Payer: Self-pay | Admitting: Neurology

## 2017-08-31 NOTE — Telephone Encounter (Signed)
Migraines have gotten worse since starting Qudexy. She thinks this made it worse the last time she tried Topiramate. She has noticed some numbness in her hands and has been missing work. She went to the ED Monday and she states it did not help. She states she does not have the money to pick up a prescription today.

## 2017-08-31 NOTE — Telephone Encounter (Signed)
Pt is returning call. Pt is on her lunch break until 1 if able to call back before then

## 2017-08-31 NOTE — Telephone Encounter (Signed)
Pt is wanting to come in for infusion today. She said she is at work and when returning the call RN will need to leave a msg, she will call [facility].

## 2017-08-31 NOTE — Telephone Encounter (Signed)
Have her stop the Quedexy. We can have her in for a migraine cocktail, discuss with the infusion center tomorrow. We started her on Ajovy and hopefully this starts working soon. I would like to start her on another preventative if the topiramate did not work, get her scheduled in the infusion center and let me know when she is here so I can go discuss with her thanks

## 2017-08-31 NOTE — Telephone Encounter (Signed)
Dr. Lucia GaskinsAhern aware and would like to suggest Almotriptan for patient. I called the patient and LVM asking for call back to discuss this.

## 2017-09-01 ENCOUNTER — Telehealth: Payer: Self-pay | Admitting: Neurology

## 2017-09-01 NOTE — Telephone Encounter (Signed)
Spoke to patient today while in infusion, discussed the following:    Idiopathic Intracranial Hypertension Idiopathic intracranial hypertension (IIH) is a condition that increases pressure around the brain. The fluid that surrounds the brain and spinal cord (cerebrospinal fluid, CSF) increases and causes the pressure. Idiopathic means that the cause of this condition is not known. IIH affects the brain and spinal cord (is a neurological disorder). If this condition is not treated, it can cause vision loss or blindness. What increases the risk? You are more likely to develop this condition if:  You are severely overweight (obese).  You are a woman who has not gone through menopause.  You take certain medicines, such as birth control or steroids.  What are the signs or symptoms? Symptoms of IIH include:  Headaches. This is the most common symptom.  Pain in the shoulders or neck.  Nausea and vomiting.  A "rushing water" or pulsing sound within the ears (pulsatile tinnitus).  Double vision.  Blurred vision.  Brief episodes of complete vision loss.  How is this diagnosed? This condition may be diagnosed based on:  Your symptoms.  Your medical history.  CT scan of the brain.  MRI of the brain.  Magnetic resonance venogram (MRV) to check veins in the brain.  Diagnostic lumbar puncture. This is a procedure to remove and examine a sample of cerebrospinal fluid. This procedure can determine whether too much fluid may be causing IIH.  A thorough eye exam to check for swelling or nerve damage in the eyes.  How is this treated? Treatment for this condition depends on your symptoms. The goal of treatment is to decrease the pressure around your brain. Common treatments include:  Medicines to decrease the production of spinal fluid and lower the pressure within your skull.  Medicines to prevent or treat headaches.  Surgery to place drains (shunts) in your brain to remove  excess fluid.  Lumbar puncture to remove excess cerebrospinal fluid.  Follow these instructions at home:  If you are overweight or obese, work with your health care provider to lose weight.  Take over-the-counter and prescription medicines only as told by your health care provider.  Do not drive or use heavy machinery while taking medicines that can make you sleepy.  Keep all follow-up visits as told by your health care provider. This is important. Contact a health care provider if:  You have changes in your vision, such as: ? Double vision. ? Not being able to see colors (color vision). Get help right away if:  You have any of the following symptoms and they get worse or do not get better. ? Headaches. ? Nausea. ? Vomiting. ? Vision changes or difficulty seeing. Summary  Idiopathic intracranial hypertension (IIH) is a condition that increases pressure around the brain. The cause is not known (is idiopathic).  The most common symptom of IIH is headaches.  Treatment may include medicines or surgery to relieve the pressure on your brain. This information is not intended to replace advice given to you by your health care provider. Make sure you discuss any questions you have with your health care provider. Document Released: 10/25/2001 Document Revised: 07/07/2016 Document Reviewed: 07/07/2016 Elsevier Interactive Patient Education  2017 [employer]    .

## 2017-09-01 NOTE — Telephone Encounter (Signed)
I called and spoke with the patient. She will come in at 2:00 for an infusion (spoke with infusion RN). Patient allergic to Toradol. She denies any nausea and says she may have a driver but is not sure yet. Patient also aware to stop the Qudexy and Dr. Lucia GaskinsAhern will discuss other options with her during her infusion. She verbalized understanding and appreciation. Will have order form ready for Dr. Lucia GaskinsAhern.

## 2017-09-05 ENCOUNTER — Other Ambulatory Visit: Payer: Self-pay | Admitting: Neurology

## 2017-09-05 ENCOUNTER — Encounter: Payer: Self-pay | Admitting: Neurology

## 2017-09-05 DIAGNOSIS — G43011 Migraine without aura, intractable, with status migrainosus: Secondary | ICD-10-CM

## 2017-09-05 MED ORDER — ACETAZOLAMIDE 250 MG PO TABS: 250.0000 mg | ORAL_TABLET | Freq: Two times a day (BID) | ORAL | 3 refills | Status: DC

## 2017-10-06 ENCOUNTER — Telehealth: Payer: Self-pay | Admitting: Neurology

## 2017-10-06 ENCOUNTER — Ambulatory Visit: Payer: 59 | Admitting: Neurology

## 2017-10-06 NOTE — Telephone Encounter (Signed)
Pt is returning call.  

## 2017-10-06 NOTE — Telephone Encounter (Signed)
I spoke with the patient regarding getting the medication filled. Her insurance will not B/B and the pharmacy stated that it is not covered. Latha is going to email me a copy of her insurance card so I can try and call the pharmacy line. I told her I would call her back once I have something figured out.

## 2017-11-09 ENCOUNTER — Encounter: Payer: Self-pay | Admitting: Neurology

## 2018-03-10 ENCOUNTER — Emergency Department (HOSPITAL_COMMUNITY)
Admission: EM | Admit: 2018-03-10 | Discharge: 2018-03-10 | Disposition: A | Discharge status: Home / Self Care | Payer: No Typology Code available for payment source | Attending: Emergency Medicine | Admitting: Emergency Medicine

## 2018-03-10 ENCOUNTER — Encounter (HOSPITAL_COMMUNITY): Payer: Self-pay | Admitting: *Deleted

## 2018-03-10 DIAGNOSIS — Z79899 Other long term (current) drug therapy: Secondary | ICD-10-CM | POA: Diagnosis not present

## 2018-03-10 DIAGNOSIS — J45909 Unspecified asthma, uncomplicated: Secondary | ICD-10-CM | POA: Diagnosis not present

## 2018-03-10 DIAGNOSIS — R51 Headache: Secondary | ICD-10-CM | POA: Insufficient documentation

## 2018-03-10 DIAGNOSIS — R519 Headache, unspecified: Secondary | ICD-10-CM

## 2018-03-10 LAB — BASIC METABOLIC PANEL
ANION GAP: 8 (ref 5–15)
BUN: 10 mg/dL (ref 6–20)
CHLORIDE: 113 mmol/L — AB (ref 98–111)
CO2: 20 mmol/L — AB (ref 22–32)
CREATININE: 0.77 mg/dL (ref 0.44–1.00)
Calcium: 8.6 mg/dL — ABNORMAL LOW (ref 8.9–10.3)
GFR calc non Af Amer: >60 mL/min (ref >60)
Glucose, Bld: 94 mg/dL (ref 70–99)
POTASSIUM: 4.2 mmol/L (ref 3.5–5.1)
Sodium: 141 mmol/L (ref 135–145)

## 2018-03-10 MED ORDER — DEXAMETHASONE 4 MG PO TABS
10.0000 mg | ORAL_TABLET | Freq: Once | ORAL | Status: AC
  Administered 2018-03-10: 10:00:00 10 mg via ORAL
  Filled 2018-03-10: qty 2

## 2018-03-10 MED ORDER — METOCLOPRAMIDE HCL 5 MG/ML IJ SOLN
10.0000 mg | Freq: Once | INTRAMUSCULAR | Status: AC
  Administered 2018-03-10: 10 mg via INTRAVENOUS
  Filled 2018-03-10: qty 2

## 2018-03-10 MED ORDER — SODIUM CHLORIDE 0.9 % IV BOLUS
500.0000 mL | Freq: Once | INTRAVENOUS | Status: AC
  Administered 2018-03-10: 500 mL via INTRAVENOUS

## 2018-03-10 MED ORDER — PROMETHAZINE HCL 25 MG PO TABS: 25.0000 mg | ORAL_TABLET | Freq: Three times a day (TID) | ORAL | 0 refills | Status: DC | PRN

## 2018-03-10 NOTE — ED Provider Notes (Signed)
[facility]-EMERGENCY DEPT Provider Note   CSN: [id number] Arrival date & time: 03/10/18  5784     History   Chief Complaint Chief Complaint  Patient presents with  . Migraine    HPI [name] is a 32 y.o. female.  The history is provided by the patient. No language interpreter was used.  Migraine    [name] is a 32 y.o. female who presents to the Emergency Department complaining of migraine. She presents to the emergency department complaining of headache, typical of her migraine headaches that started four days ago. She reports a band like pain across her forehead and behind both eyes. Symptoms are severe and constant nature. There are no clear alleviating or worsening factors. She denies any fevers, vision changes, photophobia, sore throat, nausea, vomiting, numbness, weakness. She has a history of migraine headache and takes acetazolomide BID for the last seven months. She denies any additional medical problems. She does take oral contraceptives. She denies any chance of pregnancy. Symptoms are moderate, constant and worsening. Past Medical History:  Diagnosis Date  . Arthritis    rt shoulder  . Asthma   . Biliary colic   . DJD of shoulder    right  . GERD (gastroesophageal reflux disease)   . Headache   . Hernia of abdominal wall   . Obesity   . Polycystic ovarian syndrome   . Sleep apnea    sleep study 2009, mod sleep apnea, no CPAP  . Wears glasses     Patient Active Problem List   Diagnosis Date Noted  . Morbid obesity (HCC) 05/19/2017  . Migraine 05/19/2017  . Impingement syndrome of right shoulder 11/26/2014  . AC (acromioclavicular) arthritis 11/26/2014  . S/P arthroscopy of shoulder 11/26/2014    Past Surgical History:  Procedure Laterality Date  . CHOLECYSTECTOMY N/A 06/23/2016   Procedure: LAPAROSCOPIC CHOLECYSTECTOMY;  Surgeon: Abigail Miyamoto, MD;  Location: [facility] OR;  Service: General;  Laterality: N/A;  . COLONOSCOPY  WITH ESOPHAGOGASTRODUODENOSCOPY (EGD)    . SHOULDER SURGERY     right  . WISDOM TOOTH EXTRACTION       OB History    Gravida  2   Para      Term      Preterm      AB      Living        SAB      TAB      Ectopic      Multiple      Live Births               Home Medications    Prior to Admission medications   Medication Sig Start Date End Date Taking? Authorizing Provider  acetaZOLAMIDE (DIAMOX) 250 MG tablet Take 1 tablet (250 mg total) by mouth 2 (two) times daily. 09/05/17  Yes Anson Fret, MD  albuterol (PROVENTIL HFA;VENTOLIN HFA) 108 (90 BASE) MCG/ACT inhaler Inhale 2 puffs into the lungs every 6 (six) hours as needed for wheezing or shortness of breath.    Yes [provider]  levonorgestrel-ethinyl estradiol (AVIANE,ALESSE,LESSINA) 0.1-20 MG-MCG tablet Take 1 tablet by mouth daily.   Yes [provider]  Fremanezumab-vfrm (AJOVY) 225 MG/1.5ML SOSY Inject 225 mg into the skin every 30 (thirty) days. 08/16/17   Anson Fret, MD  metoCLOPramide (REGLAN) 5 MG tablet Take 1 tablet (5 mg total) by mouth every 8 (eight) hours as needed for nausea. Patient not taking: Reported on 03/10/2018  08/29/17   Palma HolterGunadasa, Kanishka G, MD  ondansetron (ZOFRAN ODT) 4 MG disintegrating tablet Take 1 tablet (4 mg total) by mouth every 8 (eight) hours as needed for nausea or vomiting. Patient not taking: Reported on 03/10/2018 03/09/17   McDonald, Pedro EarlsMia A, PA-C  promethazine (PHENERGAN) 25 MG tablet Take 1 tablet (25 mg total) by mouth every 8 (eight) hours as needed for nausea or vomiting. 03/10/18   Tilden Fossaees, Chelle Cayton, MD  Topiramate ER (QUDEXY XR) 100 MG CS24 sprinkle capsule Take 100 mg by mouth at bedtime. Patient not taking: Reported on 03/10/2018 08/16/17   Anson FretAhern, Antonia B, MD    Family History Family History  Problem Relation Age of Onset  . Hypertension Mother   . Rheum arthritis Mother   . Rheum arthritis Other   . Congestive Heart Failure Other      Social History Social History   Tobacco Use  . Smoking status: Never Smoker  . Smokeless tobacco: Never Used  Substance Use Topics  . Alcohol use: Yes    Comment: social  . Drug use: No     Allergies   Azithromycin; Codeine; and Toradol [ketorolac tromethamine]   Review of Systems Review of Systems  All other systems reviewed and are negative.    Physical Exam Updated Vital Signs BP (!) 141/103 (BP Location: Left Arm)   Pulse (!) 102   Temp 98 F (36.7 C) (Oral)   Resp 15   LMP 03/10/2018   SpO2 98%   Breastfeeding? Unknown   Physical Exam  Constitutional: She is oriented to person, place, and time. She appears well-developed and well-nourished.  HENT:  Head: Normocephalic and atraumatic.  TMs clear bilaterally. There is a pedunculated polyp in the right ear canal without any surrounding erythema. OP without erythema or edema.   Eyes: Pupils are equal, round, and reactive to light. EOM are normal.  Neck: Neck supple.  Cardiovascular: Normal rate and regular rhythm.  No murmur heard. Pulmonary/Chest: Effort normal and breath sounds normal. No respiratory distress.  Abdominal: Soft. There is no tenderness. There is no rebound and no guarding.  Musculoskeletal: She exhibits no edema or tenderness.  Neurological: She is alert and oriented to person, place, and time. No cranial nerve deficit. Coordination normal.  5/5 strength in all four extremities.  No pronator drift.    Skin: Skin is warm and dry.  Psychiatric: She has a normal mood and affect. Her behavior is normal.  Nursing note and vitals reviewed.    ED Treatments / Results  Labs (all labs ordered are listed, but only abnormal results are displayed) Labs Reviewed  BASIC METABOLIC PANEL - Abnormal; Notable for the following components:      Result Value   Chloride 113 (*)    CO2 20 (*)    Calcium 8.6 (*)    All other components within normal limits    EKG None  Radiology No results  found.  Procedures Procedures (including critical care time)  Medications Ordered in ED Medications  sodium chloride 0.9 % bolus 500 mL (500 mLs Intravenous New Bag/Given 03/10/18 1016)  metoCLOPramide (REGLAN) injection 10 mg (10 mg Intravenous Given 03/10/18 1017)  dexamethasone (DECADRON) tablet 10 mg (10 mg Oral Given 03/10/18 1008)     Initial Impression / Assessment and Plan / ED Course  I have reviewed the triage vital signs and the nursing notes.  Pertinent labs & imaging results that were available during my care of the patient were reviewed by me and  considered in my medical decision making (see chart for details).     Patient with history of migraine headache here for evaluation of headache similar to prior migraines. She is non-toxic appearing on evaluation with no focal neurologic deficits. Patient is not consistent with dural sinus thrombosis, subarachnoid hemorrhage, meningitis, hypertensive urgency. BMP obtained given history of acetazolamide use, no recent electrolytes. BMP without significant electrolyte abnormalities. Following treatment in the department her headache is resolved. Counseled patient on home care for headache as well as outpatient follow-up. Discussed borderline hypertension in the department, recommend checking her blood pressure at home and PCP follow-up. Discussed home care for recurrent headache with Phenergan and Excedrin migraine. Discussed return precautions.  Final Clinical Impressions(s) / ED Diagnoses   Final diagnoses:  Bad headache    ED Discharge Orders        Ordered    promethazine (PHENERGAN) 25 MG tablet  Every 8 hours PRN     03/10/18 1110       Tilden Fossa, MD 03/10/18 1113

## 2018-03-10 NOTE — ED Triage Notes (Signed)
Pt complains of migraine for the past 4 days. Pt has hx of intracranial hypertension, takes acetazolamide regularly.

## 2018-03-10 NOTE — ED Notes (Signed)
Unable to obtain IV access on patient at this time.

## 2018-03-14 ENCOUNTER — Other Ambulatory Visit: Payer: Self-pay | Admitting: Neurology

## 2018-03-14 DIAGNOSIS — G43011 Migraine without aura, intractable, with status migrainosus: Secondary | ICD-10-CM

## 2018-04-11 ENCOUNTER — Emergency Department (HOSPITAL_COMMUNITY)
Admission: EM | Admit: 2018-04-11 | Discharge: 2018-04-11 | Disposition: A | Discharge status: Home / Self Care | Payer: No Typology Code available for payment source | Attending: Emergency Medicine | Admitting: Emergency Medicine

## 2018-04-11 ENCOUNTER — Emergency Department (HOSPITAL_COMMUNITY): Payer: No Typology Code available for payment source

## 2018-04-11 ENCOUNTER — Encounter (HOSPITAL_COMMUNITY): Payer: Self-pay

## 2018-04-11 ENCOUNTER — Other Ambulatory Visit: Payer: Self-pay

## 2018-04-11 DIAGNOSIS — Z79899 Other long term (current) drug therapy: Secondary | ICD-10-CM | POA: Diagnosis not present

## 2018-04-11 DIAGNOSIS — I10 Essential (primary) hypertension: Secondary | ICD-10-CM | POA: Diagnosis not present

## 2018-04-11 DIAGNOSIS — J45909 Unspecified asthma, uncomplicated: Secondary | ICD-10-CM | POA: Insufficient documentation

## 2018-04-11 DIAGNOSIS — N390 Urinary tract infection, site not specified: Secondary | ICD-10-CM | POA: Insufficient documentation

## 2018-04-11 DIAGNOSIS — N23 Unspecified renal colic: Secondary | ICD-10-CM

## 2018-04-11 DIAGNOSIS — R109 Unspecified abdominal pain: Secondary | ICD-10-CM | POA: Diagnosis present

## 2018-04-11 LAB — CBC WITH DIFFERENTIAL/PLATELET
Basophils Absolute: 0 10*3/uL (ref 0.0–0.1)
Basophils Relative: 0 %
Eosinophils Absolute: 0.1 10*3/uL (ref 0.0–0.7)
Eosinophils Relative: 2 %
HEMATOCRIT: 42.2 % (ref 36.0–46.0)
HEMOGLOBIN: 14.1 g/dL (ref 12.0–15.0)
LYMPHS ABS: 0.8 10*3/uL (ref 0.7–4.0)
Lymphocytes Relative: 10 %
MCH: 28 pg (ref 26.0–34.0)
MCHC: 33.4 g/dL (ref 30.0–36.0)
MCV: 83.7 fL (ref 78.0–100.0)
MONO ABS: 0.4 10*3/uL (ref 0.1–1.0)
MONOS PCT: 5 %
NEUTROS ABS: 6.8 10*3/uL (ref 1.7–7.7)
NEUTROS PCT: 83 %
Platelets: 255 10*3/uL (ref 150–400)
RBC: 5.04 MIL/uL (ref 3.87–5.11)
RDW: 12.6 % (ref 11.5–15.5)
WBC: 8.2 10*3/uL (ref 4.0–10.5)

## 2018-04-11 LAB — URINALYSIS, ROUTINE W REFLEX MICROSCOPIC
Bilirubin Urine: NEGATIVE
Glucose, UA: NEGATIVE mg/dL
Ketones, ur: NEGATIVE mg/dL
NITRITE: NEGATIVE
PROTEIN: NEGATIVE mg/dL
RBC / HPF: >50 RBC/hpf — ABNORMAL HIGH (ref 0–5)
SPECIFIC GRAVITY, URINE: 1.018 (ref 1.005–1.030)
pH: 6 (ref 5.0–8.0)

## 2018-04-11 LAB — COMPREHENSIVE METABOLIC PANEL
ALT: 16 U/L (ref 0–44)
ANION GAP: 6 (ref 5–15)
AST: 20 U/L (ref 15–41)
Albumin: 3.7 g/dL (ref 3.5–5.0)
Alkaline Phosphatase: 69 U/L (ref 38–126)
BILIRUBIN TOTAL: 1 mg/dL (ref 0.3–1.2)
BUN: 13 mg/dL (ref 6–20)
CALCIUM: 8.7 mg/dL — AB (ref 8.9–10.3)
CO2: 26 mmol/L (ref 22–32)
Chloride: 107 mmol/L (ref 98–111)
Creatinine, Ser: 0.8 mg/dL (ref 0.44–1.00)
GLUCOSE: 104 mg/dL — AB (ref 70–99)
Potassium: 4.2 mmol/L (ref 3.5–5.1)
Sodium: 139 mmol/L (ref 135–145)
TOTAL PROTEIN: 7.2 g/dL (ref 6.5–8.1)

## 2018-04-11 LAB — I-STAT BETA HCG BLOOD, ED (MC, WL, AP ONLY)

## 2018-04-11 MED ORDER — SODIUM CHLORIDE 0.9 % IV SOLN
1.0000 g | Freq: Once | INTRAVENOUS | Status: AC
  Administered 2018-04-11: 1 g via INTRAVENOUS
  Filled 2018-04-11: qty 10

## 2018-04-11 MED ORDER — TAMSULOSIN HCL 0.4 MG PO CAPS: 0.4000 mg | ORAL_CAPSULE | Freq: Every day | ORAL | 0 refills | Status: DC

## 2018-04-11 MED ORDER — HYDROMORPHONE HCL 1 MG/ML IJ SOLN
1.0000 mg | Freq: Once | INTRAMUSCULAR | Status: AC
  Administered 2018-04-11: 1 mg via INTRAVENOUS
  Filled 2018-04-11: qty 1

## 2018-04-11 MED ORDER — SODIUM CHLORIDE 0.9 % IV BOLUS
1000.0000 mL | Freq: Once | INTRAVENOUS | Status: AC
  Administered 2018-04-11: 1000 mL via INTRAVENOUS

## 2018-04-11 MED ORDER — CEPHALEXIN 500 MG PO CAPS: 500.0000 mg | ORAL_CAPSULE | Freq: Three times a day (TID) | ORAL | 0 refills | Status: DC

## 2018-04-11 MED ORDER — HYDROCODONE-ACETAMINOPHEN 5-325 MG PO TABS: 1.0000 | ORAL_TABLET | Freq: Four times a day (QID) | ORAL | 0 refills | Status: DC | PRN

## 2018-04-11 MED ORDER — ONDANSETRON HCL 4 MG/2ML IJ SOLN
4.0000 mg | Freq: Once | INTRAMUSCULAR | Status: AC
  Administered 2018-04-11: 4 mg via INTRAVENOUS
  Filled 2018-04-11: qty 2

## 2018-04-11 MED ORDER — MORPHINE SULFATE (PF) 4 MG/ML IV SOLN
4.0000 mg | Freq: Once | INTRAVENOUS | Status: AC
  Administered 2018-04-11: 4 mg via INTRAVENOUS
  Filled 2018-04-11: qty 1

## 2018-04-11 NOTE — Discharge Instructions (Addendum)
Take tylenol for pain.   Take vicodin for severe pain   Take flomax daily to help pass your kidney stone   Take keflex three times daily for a week for UTI   See urology in a week if you still have pain   Return to ER if you have worse flank pain, abdominal pain, vomiting, fever.

## 2018-04-11 NOTE — ED Notes (Signed)
Ultrasound in room

## 2018-04-11 NOTE — ED Notes (Signed)
Pt made aware that urine collection is needed  

## 2018-04-11 NOTE — ED Provider Notes (Signed)
[facility]   CSN: [id number] Arrival date & time: 04/11/18  0757     History   Chief Complaint Chief Complaint  Patient presents with  . Flank Pain    HPI [name] is a 32 y.o. female hx of PCOS, previous cholecystectomy here with R flank pain.  Patient has acute onset of right flank pain that started this morning.  She states that the pain is sharp and radiates to the right upper quadrant.  Denies any hematuria or dysuria or fevers.  Patient states that she felt nauseated and took some Motrin with no relief.  Denies any fevers or chills or diarrhea.  Denies any previous history of kidney stones.  The history is provided by the patient.    Past Medical History:  Diagnosis Date  . Arthritis    rt shoulder  . Asthma   . Biliary colic   . DJD of shoulder    right  . GERD (gastroesophageal reflux disease)   . Headache   . Hernia of abdominal wall   . Obesity   . Polycystic ovarian syndrome   . Sleep apnea    sleep study 2009, mod sleep apnea, no CPAP  . Wears glasses     Patient Active Problem List   Diagnosis Date Noted  . Morbid obesity (HCC) 05/19/2017  . Migraine 05/19/2017  . Impingement syndrome of right shoulder 11/26/2014  . AC (acromioclavicular) arthritis 11/26/2014  . S/P arthroscopy of shoulder 11/26/2014    Past Surgical History:  Procedure Laterality Date  . CHOLECYSTECTOMY N/A 06/23/2016   Procedure: LAPAROSCOPIC CHOLECYSTECTOMY;  Surgeon: Abigail Miyamotoouglas Blackman, MD;  Location: [facility] OR;  Service: General;  Laterality: N/A;  . COLONOSCOPY WITH ESOPHAGOGASTRODUODENOSCOPY (EGD)    . SHOULDER SURGERY     right  . WISDOM TOOTH EXTRACTION       OB History    Gravida  2   Para      Term      Preterm      AB      Living        SAB      TAB      Ectopic      Multiple      Live Births               Home Medications    Prior to Admission medications   Medication Sig Start Date End  Date Taking? Authorizing Provider  acetaZOLAMIDE (DIAMOX) 250 MG tablet TAKE 1 TABLET(250 MG) BY MOUTH TWICE DAILY 03/14/18  Yes Anson FretAhern, Antonia B, MD  albuterol (PROVENTIL HFA;VENTOLIN HFA) 108 (90 BASE) MCG/ACT inhaler Inhale 2 puffs into the lungs every 6 (six) hours as needed for wheezing or shortness of breath.    Yes [provider]  levonorgestrel-ethinyl estradiol (AVIANE,ALESSE,LESSINA) 0.1-20 MG-MCG tablet Take 1 tablet by mouth daily.   Yes [provider]  promethazine (PHENERGAN) 25 MG tablet Take 1 tablet (25 mg total) by mouth every 8 (eight) hours as needed for nausea or vomiting. 03/10/18  Yes Tilden Fossaees, Elizabeth, MD  Fremanezumab-vfrm (AJOVY) 225 MG/1.5ML SOSY Inject 225 mg into the skin every 30 (thirty) days. Patient not taking: Reported on 04/11/2018 08/16/17   Anson FretAhern, Antonia B, MD  metoCLOPramide (REGLAN) 5 MG tablet Take 1 tablet (5 mg total) by mouth every 8 (eight) hours as needed for nausea. Patient not taking: Reported on 03/10/2018 08/29/17   Palma HolterGunadasa, Kanishka G, MD  ondansetron (ZOFRAN ODT) 4 MG  disintegrating tablet Take 1 tablet (4 mg total) by mouth every 8 (eight) hours as needed for nausea or vomiting. Patient not taking: Reported on 03/10/2018 03/09/17   McDonald, Pedro EarlsMia A, PA-C  Topiramate ER (QUDEXY XR) 100 MG CS24 sprinkle capsule Take 100 mg by mouth at bedtime. Patient not taking: Reported on 03/10/2018 08/16/17   Anson FretAhern, Antonia B, MD    Family History Family History  Problem Relation Age of Onset  . Hypertension Mother   . Rheum arthritis Mother   . Rheum arthritis Other   . Congestive Heart Failure Other     Social History Social History   Tobacco Use  . Smoking status: Never Smoker  . Smokeless tobacco: Never Used  Substance Use Topics  . Alcohol use: Yes    Comment: social  . Drug use: No     Allergies   Azithromycin; Codeine; Other; and Toradol [ketorolac tromethamine]   Review of Systems Review of Systems  Genitourinary:  Positive for flank pain.  All other systems reviewed and are negative.    Physical Exam Updated Vital Signs BP (!) 171/98 (BP Location: Left Arm)   Pulse 73   Temp 98 F (36.7 C) (Oral)   Resp 15   Ht 5' 3.25" (1.607 m)   Wt 127 kg   LMP 04/09/2018   SpO2 96%   BMI 49.21 kg/m   Physical Exam  Constitutional: She is oriented to person, place, and time.  Slightly uncomfortable   HENT:  Head: Normocephalic.  Slightly dehydrated   Eyes: Pupils are equal, round, and reactive to light. Conjunctivae and EOM are normal.  Neck: Normal range of motion. Neck supple.  Cardiovascular: Normal rate, regular rhythm and normal heart sounds.  Pulmonary/Chest: Effort normal and breath sounds normal. No stridor. No respiratory distress. She has no wheezes.  Abdominal: Soft. Bowel sounds are normal.  + R CVAT. Mild RUQ tenderness but neg Murphy's. No lower abdominal tenderness   Musculoskeletal: Normal range of motion.  Neurological: She is alert and oriented to person, place, and time.  Skin: Skin is warm. Capillary refill takes less than 2 seconds.  Psychiatric: She has a normal mood and affect.  Nursing note and vitals reviewed.    ED Treatments / Results  Labs (all labs ordered are listed, but only abnormal results are displayed) Labs Reviewed  URINALYSIS, ROUTINE W REFLEX MICROSCOPIC - Abnormal; Notable for the following components:      Result Value   APPearance HAZY (*)    Hgb urine dipstick MODERATE (*)    Leukocytes, UA SMALL (*)    RBC / HPF >50 (*)    Bacteria, UA RARE (*)    All other components within normal limits  COMPREHENSIVE METABOLIC PANEL - Abnormal; Notable for the following components:   Glucose, Bld 104 (*)    Calcium 8.7 (*)    All other components within normal limits  URINE CULTURE  CBC WITH DIFFERENTIAL/PLATELET  I-STAT BETA HCG BLOOD, ED ([facility], [facility], [facility] ONLY)    EKG None  Radiology Dg Abdomen 1 View  Result Date: 04/11/2018 CLINICAL DATA:  Right  flank pain, nausea EXAM: ABDOMEN - 1 VIEW COMPARISON:  CT 08/05/2016 FINDINGS: The bowel gas pattern is normal. No radio-opaque calculi or other significant radiographic abnormality are seen. Regional bones unremarkable. Cholecystectomy clips. IMPRESSION: Negative. Electronically Signed   By: Corlis Leak  Hassell M.D.   On: 04/11/2018 10:13   [place] Renal  Result Date: 04/11/2018 CLINICAL DATA:  Acute right flank pain EXAM:  RENAL / URINARY TRACT ULTRASOUND COMPLETE COMPARISON:  None. FINDINGS: Right Kidney: Length: 11.4 cm. Echogenicity within normal limits. No mass visualized. Mild right hydronephrosis. Left Kidney: Length: 11.8 cm. Echogenicity within normal limits. No mass or hydronephrosis visualized. Bladder: Appears normal for degree of bladder distention. IMPRESSION: 1. Mild right hydronephrosis. Electronically Signed   By: Elige Ko   On: 04/11/2018 09:41    Procedures Procedures (including critical care time)  Medications Ordered in ED Medications  cefTRIAXone (ROCEPHIN) 1 g in sodium chloride 0.9 % 100 mL IVPB (1 g Intravenous New Bag/Given 04/11/18 1223)  sodium chloride 0.9 % bolus 1,000 mL (0 mLs Intravenous Stopped 04/11/18 1120)  ondansetron (ZOFRAN) injection 4 mg (4 mg Intravenous Given 04/11/18 0930)  morphine 4 MG/ML injection 4 mg (4 mg Intravenous Given 04/11/18 0930)  HYDROmorphone (DILAUDID) injection 1 mg (1 mg Intravenous Given 04/11/18 1123)  sodium chloride 0.9 % bolus 1,000 mL (1,000 mLs Intravenous New Bag/Given 04/11/18 1123)     Initial Impression / Assessment and Plan / ED Course  I have reviewed the triage vital signs and the nursing notes.  Pertinent labs & imaging results that were available during my care of the patient were reviewed by me and considered in my medical decision making (see chart for details).    Sharnika JOYA WILLMOTT is a 32 y.o. female here with R flank pain. I think likely renal colic vs pyelo. Will get labs, UA, Renal US. She had previous cholecystectomy.    12:48 PM UA + blood, ? UTI. US showed mild R hydro. Xray showed no obvious large stone. Pain controlled with pain meds. Given rocephin. Given that she has hydro, will give course of keflex, vicodin, flomax. Urine culture sent. Will have her follow up with urology outpatient.    Final Clinical Impressions(s) / ED Diagnoses   Final diagnoses:  None    ED Discharge Orders    None       Charlynne Pander, MD 04/11/18 1250

## 2018-04-11 NOTE — ED Triage Notes (Signed)
patient c/o right flank pain that radiates into the right lower abdomen.since [id number] today. Patient states pain is better when she lies flat. Patient denies any urinary problems.

## 2018-04-12 LAB — URINE CULTURE

## 2018-05-05 ENCOUNTER — Other Ambulatory Visit: Payer: Self-pay

## 2018-05-05 ENCOUNTER — Encounter (HOSPITAL_COMMUNITY): Payer: Self-pay

## 2018-05-05 ENCOUNTER — Emergency Department (HOSPITAL_COMMUNITY)
Admission: EM | Admit: 2018-05-05 | Discharge: 2018-05-05 | Disposition: A | Discharge status: Home / Self Care | Payer: No Typology Code available for payment source | Attending: Emergency Medicine | Admitting: Emergency Medicine

## 2018-05-05 DIAGNOSIS — Z79899 Other long term (current) drug therapy: Secondary | ICD-10-CM | POA: Insufficient documentation

## 2018-05-05 DIAGNOSIS — J45909 Unspecified asthma, uncomplicated: Secondary | ICD-10-CM | POA: Diagnosis not present

## 2018-05-05 DIAGNOSIS — G43109 Migraine with aura, not intractable, without status migrainosus: Secondary | ICD-10-CM | POA: Insufficient documentation

## 2018-05-05 DIAGNOSIS — R51 Headache: Secondary | ICD-10-CM | POA: Diagnosis present

## 2018-05-05 MED ORDER — DIPHENHYDRAMINE HCL 50 MG/ML IJ SOLN
50.0000 mg | Freq: Once | INTRAMUSCULAR | Status: AC
  Administered 2018-05-05: 50 mg via INTRAVENOUS
  Filled 2018-05-05: qty 1

## 2018-05-05 MED ORDER — PROCHLORPERAZINE EDISYLATE 10 MG/2ML IJ SOLN
10.0000 mg | Freq: Once | INTRAMUSCULAR | Status: AC
  Administered 2018-05-05: 10 mg via INTRAVENOUS
  Filled 2018-05-05: qty 2

## 2018-05-05 NOTE — ED Notes (Signed)
Patient verbalized understanding of discharge instructions, no questions. Patient ambulated out of ED with steady gait in no distress.  

## 2018-05-05 NOTE — Discharge Instructions (Addendum)
Have your blood pressure check next week.  And follow-up with your doctor if you have continued problems with your migraine

## 2018-05-05 NOTE — ED Provider Notes (Signed)
[facility]-EMERGENCY DEPT Provider Note   CSN: [id number] Arrival date & time: 05/05/18  0905     History   Chief Complaint Chief Complaint  Patient presents with  . Migraine    HPI [name] is a 32 y.o. female.  Patient complains of migraine headache.  Patient has a history of migraines patient has not been vomiting  The history is provided by the patient. No language interpreter was used.  Migraine  This is a new problem. The current episode started 12 to 24 hours ago. The problem occurs constantly. The problem has not changed since onset.Associated symptoms include headaches. Pertinent negatives include no chest pain and no abdominal pain. Nothing aggravates the symptoms. Nothing relieves the symptoms. Treatments tried: Unknown. The treatment provided no relief.    Past Medical History:  Diagnosis Date  . Arthritis    rt shoulder  . Asthma   . Biliary colic   . DJD of shoulder    right  . GERD (gastroesophageal reflux disease)   . Headache   . Hernia of abdominal wall   . Obesity   . Polycystic ovarian syndrome   . Sleep apnea    sleep study 2009, mod sleep apnea, no CPAP  . Wears glasses     Patient Active Problem List   Diagnosis Date Noted  . Morbid obesity (HCC) 05/19/2017  . Migraine 05/19/2017  . Impingement syndrome of right shoulder 11/26/2014  . AC (acromioclavicular) arthritis 11/26/2014  . S/P arthroscopy of shoulder 11/26/2014    Past Surgical History:  Procedure Laterality Date  . CHOLECYSTECTOMY N/A 06/23/2016   Procedure: LAPAROSCOPIC CHOLECYSTECTOMY;  Surgeon: Abigail Miyamoto, MD;  Location: [facility] OR;  Service: General;  Laterality: N/A;  . COLONOSCOPY WITH ESOPHAGOGASTRODUODENOSCOPY (EGD)    . SHOULDER SURGERY     right  . WISDOM TOOTH EXTRACTION       OB History    Gravida  2   Para      Term      Preterm      AB      Living        SAB      TAB      Ectopic      Multiple      Live Births               Home Medications    Prior to Admission medications   Medication Sig Start Date End Date Taking? Authorizing Provider  acetaZOLAMIDE (DIAMOX) 250 MG tablet TAKE 1 TABLET(250 MG) BY MOUTH TWICE DAILY Patient taking differently: Take 250 mg by mouth 2 (two) times daily.  03/14/18  Yes Anson Fret, MD  albuterol (PROVENTIL HFA;VENTOLIN HFA) 108 (90 BASE) MCG/ACT inhaler Inhale 2 puffs into the lungs every 6 (six) hours as needed for wheezing or shortness of breath.    Yes [provider]  levonorgestrel-ethinyl estradiol (AVIANE,ALESSE,LESSINA) 0.1-20 MG-MCG tablet Take 1 tablet by mouth daily.   Yes [provider]  cephALEXin (KEFLEX) 500 MG capsule Take 1 capsule (500 mg total) by mouth 3 (three) times daily. Patient not taking: Reported on 05/05/2018 04/11/18   Charlynne Pander, MD  Fremanezumab-vfrm (AJOVY) 225 MG/1.5ML SOSY Inject 225 mg into the skin every 30 (thirty) days. Patient not taking: Reported on 04/11/2018 08/16/17   Anson Fret, MD  HYDROcodone-acetaminophen (NORCO/VICODIN) 5-325 MG tablet Take 1 tablet by mouth every 6 (six) hours as needed. Patient not taking: Reported on 05/05/2018 04/11/18  Charlynne Pander, MD  metoCLOPramide (REGLAN) 5 MG tablet Take 1 tablet (5 mg total) by mouth every 8 (eight) hours as needed for nausea. Patient not taking: Reported on 03/10/2018 08/29/17   Palma Holter, MD  ondansetron (ZOFRAN ODT) 4 MG disintegrating tablet Take 1 tablet (4 mg total) by mouth every 8 (eight) hours as needed for nausea or vomiting. Patient not taking: Reported on 03/10/2018 03/09/17   McDonald, Mia A, PA-C  promethazine (PHENERGAN) 25 MG tablet Take 1 tablet (25 mg total) by mouth every 8 (eight) hours as needed for nausea or vomiting. Patient not taking: Reported on 05/05/2018 03/10/18   Tilden Fossa, MD  tamsulosin (FLOMAX) 0.4 MG CAPS capsule Take 1 capsule (0.4 mg total) by mouth daily. Patient not taking: Reported on  05/05/2018 04/11/18   Charlynne Pander, MD  Topiramate ER (QUDEXY XR) 100 MG CS24 sprinkle capsule Take 100 mg by mouth at bedtime. Patient not taking: Reported on 03/10/2018 08/16/17   Anson Fret, MD    Family History Family History  Problem Relation Age of Onset  . Hypertension Mother   . Rheum arthritis Mother   . Rheum arthritis Other   . Congestive Heart Failure Other     Social History Social History   Tobacco Use  . Smoking status: Never Smoker  . Smokeless tobacco: Never Used  Substance Use Topics  . Alcohol use: Yes    Comment: social  . Drug use: No     Allergies   Azithromycin; Codeine; Other; and Toradol [ketorolac tromethamine]   Review of Systems Review of Systems  Constitutional: Negative for appetite change and fatigue.  HENT: Negative for congestion, ear discharge and sinus pressure.   Eyes: Negative for discharge.  Respiratory: Negative for cough.   Cardiovascular: Negative for chest pain.  Gastrointestinal: Negative for abdominal pain and diarrhea.  Genitourinary: Negative for frequency and hematuria.  Musculoskeletal: Negative for back pain.  Skin: Negative for rash.  Neurological: Positive for headaches. Negative for seizures.  Psychiatric/Behavioral: Negative for hallucinations.     Physical Exam Updated Vital Signs BP (!) 159/113 (BP Location: Right Arm)   Pulse 98   Temp 98.1 F (36.7 C) (Oral)   Resp 18   Ht 5' 3.5" (1.613 m)   Wt 127 kg   LMP 05/04/2018   SpO2 100%   BMI 48.82 kg/m   Physical Exam  Constitutional: She is oriented to person, place, and time. She appears well-developed.  HENT:  Head: Normocephalic.  Eyes: Conjunctivae and EOM are normal. No scleral icterus.  Neck: Neck supple. No thyromegaly present.  Cardiovascular: Normal rate and regular rhythm. Exam reveals no gallop and no friction rub.  No murmur heard. Pulmonary/Chest: No stridor. She has no wheezes. She has no rales. She exhibits no tenderness.    Abdominal: She exhibits no distension. There is no tenderness. There is no rebound.  Musculoskeletal: Normal range of motion. She exhibits no edema.  Lymphadenopathy:    She has no cervical adenopathy.  Neurological: She is oriented to person, place, and time. She exhibits normal muscle tone. Coordination normal.  Skin: No rash noted. No erythema.  Psychiatric: She has a normal mood and affect. Her behavior is normal.     ED Treatments / Results  Labs (all labs ordered are listed, but only abnormal results are displayed) Labs Reviewed - No data to display  EKG None  Radiology No results found.  Procedures Procedures (including critical care time)  Medications Ordered in  ED Medications  prochlorperazine (COMPAZINE) injection 10 mg (10 mg Intravenous Given 05/05/18 0935)  diphenhydrAMINE (BENADRYL) injection 50 mg (50 mg Intravenous Given 05/05/18 0935)     Initial Impression / Assessment and Plan / ED Course  I have reviewed the triage vital signs and the nursing notes.  Pertinent labs & imaging results that were available during my care of the patient were reviewed by me and considered in my medical decision making (see chart for details).     Patient with a migraine headache.  Patient improved with Compazine and Benadryl.  She will follow-up with her PCP for headache and she will get her blood pressure checked next week  Final Clinical Impressions(s) / ED Diagnoses   Final diagnoses:  Migraine with aura and without status migrainosus, not intractable    ED Discharge Orders    None       Bethann Berkshire, MD 05/05/18 1049

## 2018-05-05 NOTE — ED Triage Notes (Signed)
Pt reports that she has had a migraine for 3 days. Pt has a hx of the same. Pt states that she has not been able to afford her medication for migraines (Acetazolamide 250mg  tabs) since June. 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

## 2018-08-28 ENCOUNTER — Emergency Department (HOSPITAL_COMMUNITY)
Admission: EM | Admit: 2018-08-28 | Discharge: 2018-08-28 | Disposition: A | Discharge status: Home / Self Care | Payer: PRIVATE HEALTH INSURANCE | Attending: Emergency Medicine | Admitting: Emergency Medicine

## 2018-08-28 ENCOUNTER — Encounter (HOSPITAL_COMMUNITY): Payer: Self-pay | Admitting: Emergency Medicine

## 2018-08-28 ENCOUNTER — Other Ambulatory Visit: Payer: Self-pay

## 2018-08-28 ENCOUNTER — Emergency Department (HOSPITAL_COMMUNITY): Payer: PRIVATE HEALTH INSURANCE

## 2018-08-28 DIAGNOSIS — J45909 Unspecified asthma, uncomplicated: Secondary | ICD-10-CM | POA: Insufficient documentation

## 2018-08-28 DIAGNOSIS — N201 Calculus of ureter: Secondary | ICD-10-CM | POA: Insufficient documentation

## 2018-08-28 DIAGNOSIS — Z79899 Other long term (current) drug therapy: Secondary | ICD-10-CM | POA: Insufficient documentation

## 2018-08-28 LAB — URINALYSIS, ROUTINE W REFLEX MICROSCOPIC
GLUCOSE, UA: NEGATIVE mg/dL
KETONES UR: NEGATIVE mg/dL
LEUKOCYTES UA: NEGATIVE
NITRITE: NEGATIVE
Protein, ur: 30 mg/dL — AB
pH: 6 (ref 5.0–8.0)

## 2018-08-28 LAB — COMPREHENSIVE METABOLIC PANEL
ALT: 15 U/L (ref 0–44)
AST: 17 U/L (ref 15–41)
Albumin: 4 g/dL (ref 3.5–5.0)
Alkaline Phosphatase: 60 U/L (ref 38–126)
Anion gap: 9 (ref 5–15)
BUN: 17 mg/dL (ref 6–20)
CHLORIDE: 108 mmol/L (ref 98–111)
CO2: 21 mmol/L — AB (ref 22–32)
CREATININE: 1.02 mg/dL — AB (ref 0.44–1.00)
Calcium: 8.4 mg/dL — ABNORMAL LOW (ref 8.9–10.3)
Glucose, Bld: 140 mg/dL — ABNORMAL HIGH (ref 70–99)
POTASSIUM: 3.4 mmol/L — AB (ref 3.5–5.1)
SODIUM: 138 mmol/L (ref 135–145)
Total Bilirubin: 0.7 mg/dL (ref 0.3–1.2)
Total Protein: 7.5 g/dL (ref 6.5–8.1)

## 2018-08-28 LAB — URINALYSIS, MICROSCOPIC (REFLEX)

## 2018-08-28 LAB — LIPASE, BLOOD: LIPASE: 36 U/L (ref 11–51)

## 2018-08-28 LAB — CBC
HCT: 41.6 % (ref 36.0–46.0)
HEMOGLOBIN: 13.6 g/dL (ref 12.0–15.0)
MCH: 27.1 pg (ref 26.0–34.0)
MCHC: 32.7 g/dL (ref 30.0–36.0)
MCV: 82.9 fL (ref 80.0–100.0)
NRBC: 0 % (ref 0.0–0.2)
Platelets: 248 10*3/uL (ref 150–400)
RBC: 5.02 MIL/uL (ref 3.87–5.11)
RDW: 12.6 % (ref 11.5–15.5)
WBC: 10.1 10*3/uL (ref 4.0–10.5)

## 2018-08-28 LAB — I-STAT BETA HCG BLOOD, ED (MC, WL, AP ONLY): I-stat hCG, quantitative: <5 m[IU]/mL (ref <5)

## 2018-08-28 MED ORDER — ONDANSETRON HCL 4 MG/2ML IJ SOLN
4.0000 mg | Freq: Once | INTRAMUSCULAR | Status: AC
  Administered 2018-08-28: 4 mg via INTRAVENOUS
  Filled 2018-08-28: qty 2

## 2018-08-28 MED ORDER — DIPHENHYDRAMINE HCL 50 MG/ML IJ SOLN
25.0000 mg | Freq: Once | INTRAMUSCULAR | Status: AC
  Administered 2018-08-28: 25 mg via INTRAVENOUS
  Filled 2018-08-28: qty 1

## 2018-08-28 MED ORDER — PROMETHAZINE HCL 25 MG PO TABS: 25.0000 mg | ORAL_TABLET | Freq: Three times a day (TID) | ORAL | 0 refills | Status: AC | PRN

## 2018-08-28 MED ORDER — ONDANSETRON 4 MG PO TBDP: 4.0000 mg | ORAL_TABLET | Freq: Once | ORAL | Status: DC | PRN

## 2018-08-28 MED ORDER — IBUPROFEN 600 MG PO TABS: 600.0000 mg | ORAL_TABLET | Freq: Four times a day (QID) | ORAL | 0 refills | Status: AC | PRN

## 2018-08-28 MED ORDER — KETOROLAC TROMETHAMINE 30 MG/ML IJ SOLN
30.0000 mg | Freq: Once | INTRAMUSCULAR | Status: AC
  Administered 2018-08-28: 30 mg via INTRAVENOUS
  Filled 2018-08-28: qty 1

## 2018-08-28 MED ORDER — HYDROMORPHONE HCL 1 MG/ML IJ SOLN
1.0000 mg | Freq: Once | INTRAMUSCULAR | Status: AC
Start: 2018-08-28 — End: 2018-08-28
  Administered 2018-08-28: 1 mg via INTRAVENOUS
  Filled 2018-08-28: qty 1

## 2018-08-28 MED ORDER — OXYCODONE-ACETAMINOPHEN 5-325 MG PO TABS: 1.0000 | ORAL_TABLET | Freq: Three times a day (TID) | ORAL | 0 refills | Status: AC | PRN

## 2018-08-28 MED ORDER — TAMSULOSIN HCL 0.4 MG PO CAPS: 0.4000 mg | ORAL_CAPSULE | Freq: Every day | ORAL | 0 refills | Status: AC

## 2018-08-28 NOTE — Discharge Instructions (Signed)
Thank you for allowing me to care for you today in the Emergency Department.   If you have not passed the stone in 1 week, please call [facility] to schedule follow-up appointment.  If you pass the stone at home, strain your urine to collect the stone as they can be sent for evaluation.  For pain control, take 600 mg of ibuprofen with food or 650 mg of Tylenol every 6 hours for mild to moderate pain.  For severe pain, you can take 1 tablet of Percocet every 8 hours.  Do not work or drive while taking this medication because it is a narcotic and can make you drowsy.  It can also be addicting.  Do not drink alcohol while taking this medication.  Take 1 tablet of Flomax daily until you pass the stone.  For nausea vomiting, take 1 tablet of promethazine every 8 hours.  Continue to drink plenty of fluids he do not get dehydrated.  Return to the emergency department if you develop a high fever despite taking ibuprofen and Tylenol, persistent vomiting despite taking promethazine, if you develop a large amount of blood in your urine, uncontrollable pain, or other new, concerning symptoms.

## 2018-08-28 NOTE — ED Triage Notes (Signed)
Patient is complaining of vomiting, hot, and lower right back and flank pain. Patient states it started around 12:45 am.

## 2018-08-28 NOTE — ED Provider Notes (Signed)
[facility]   CSN: [id number] Arrival date & time: 08/28/18  0400     History   Chief Complaint Chief Complaint  Patient presents with  . Flank Pain    HPI [name] is a 32 y.o. female with a history of PCOS, GERD, asthma, morbid obesity, and OSA who presents to the emergency department with a chief complaint of right flank pain.  The patient endorses sudden onset, constant, gradually worsening right flank pain that radiates to her right abdomen that awoke her from sleep at 12:30 AM.  States that she has had a history of similar pain which she was seen in the emergency department in August and was diagnosed with a kidney stone.  She has been unable to get comfortable.  No known aggravating or alleviating factors.  She reports subjective fever and chills and 6-7 episodes of nonbloody, nonbilious emesis.  She denies hematuria, dysuria, urinary frequency or hesitancy, vaginal pain or discharge, constipation, diarrhea, chest pain, shortness of breath.  No treatment prior to arrival.  She has not followed up with urology since her last ED visit. LMP 07/29/18.   The history is provided by the patient. No language interpreter was used.    Past Medical History:  Diagnosis Date  . Arthritis    rt shoulder  . Asthma   . Biliary colic   . DJD of shoulder    right  . GERD (gastroesophageal reflux disease)   . Headache   . Hernia of abdominal wall   . Obesity   . Polycystic ovarian syndrome   . Sleep apnea    sleep study 2009, mod sleep apnea, no CPAP  . Wears glasses     Patient Active Problem List   Diagnosis Date Noted  . Morbid obesity (HCC) 05/19/2017  . Migraine 05/19/2017  . Impingement syndrome of right shoulder 11/26/2014  . AC (acromioclavicular) arthritis 11/26/2014  . S/P arthroscopy of shoulder 11/26/2014    Past Surgical History:  Procedure Laterality Date  . CHOLECYSTECTOMY N/A 06/23/2016   Procedure:  LAPAROSCOPIC CHOLECYSTECTOMY;  Surgeon: Abigail Miyamoto, MD;  Location: [facility] OR;  Service: General;  Laterality: N/A;  . COLONOSCOPY WITH ESOPHAGOGASTRODUODENOSCOPY (EGD)    . SHOULDER SURGERY     right  . WISDOM TOOTH EXTRACTION       OB History    Gravida  2   Para      Term      Preterm      AB      Living        SAB      TAB      Ectopic      Multiple      Live Births               Home Medications    Prior to Admission medications   Medication Sig Start Date End Date Taking? Authorizing Provider  albuterol (PROVENTIL HFA;VENTOLIN HFA) 108 (90 BASE) MCG/ACT inhaler Inhale 2 puffs into the lungs every 6 (six) hours as needed for wheezing or shortness of breath.    Yes [provider]  Influenza vac split quadrivalent PF (AFLURIA QUADRIVALENT) 0.5 ML injection Inject 0.5 mLs into the muscle once.    Yes [provider]  levonorgestrel-ethinyl estradiol (AVIANE,ALESSE,LESSINA) 0.1-20 MG-MCG tablet Take 1 tablet by mouth daily.   Yes [provider]  ibuprofen (ADVIL,MOTRIN) 600 MG tablet Take 1 tablet (600 mg total) by mouth every 6 (six)  hours as needed for mild pain or moderate pain. 08/28/18   McDonald, Mia A, PA-C  oxyCODONE-acetaminophen (PERCOCET/ROXICET) 5-325 MG tablet Take 1 tablet by mouth every 8 (eight) hours as needed for severe pain. 08/28/18   McDonald, Mia A, PA-C  promethazine (PHENERGAN) 25 MG tablet Take 1 tablet (25 mg total) by mouth every 8 (eight) hours as needed for nausea or vomiting. 08/28/18   McDonald, Mia A, PA-C  tamsulosin (FLOMAX) 0.4 MG CAPS capsule Take 1 capsule (0.4 mg total) by mouth daily. 08/28/18   McDonald, Coral ElseMia A, PA-C    Family History Family History  Problem Relation Age of Onset  . Hypertension Mother   . Rheum arthritis Mother   . Rheum arthritis Other   . Congestive Heart Failure Other     Social History Social History   Tobacco Use  . Smoking status: Never Smoker  . Smokeless  tobacco: Never Used  Substance Use Topics  . Alcohol use: Yes    Comment: social  . Drug use: No     Allergies   Azithromycin; Codeine; Other; and Toradol [ketorolac tromethamine]   Review of Systems Review of Systems  Constitutional: Positive for chills and fever. Negative for activity change.  HENT: Negative for congestion, sinus pressure and sinus pain.   Respiratory: Negative for shortness of breath.   Cardiovascular: Negative for chest pain.  Gastrointestinal: Positive for abdominal pain and vomiting. Negative for constipation, diarrhea and nausea.  Genitourinary: Positive for flank pain. Negative for dyspareunia, dysuria, urgency, vaginal bleeding, vaginal discharge and vaginal pain.  Musculoskeletal: Negative for back pain.  Skin: Negative for rash.  Allergic/Immunologic: Negative for immunocompromised state.  Neurological: Negative for seizures, weakness, numbness and headaches.  Psychiatric/Behavioral: Negative for confusion.     Physical Exam Updated Vital Signs BP 131/82 (BP Location: Left Arm)   Pulse 69   Temp 98.1 F (36.7 C) (Oral)   Resp 18   Ht 5\' 3"  (1.6 m)   Wt (!) 139.7 kg   LMP 07/29/2018   SpO2 96%   BMI 54.56 kg/m   Physical Exam Vitals signs and nursing note reviewed.  Constitutional:      General: She is not in acute distress.    Comments: Morbidly obese.   HENT:     Head: Normocephalic.  Eyes:     General: No scleral icterus.    Conjunctiva/sclera: Conjunctivae normal.  Neck:     Musculoskeletal: Neck supple.  Cardiovascular:     Rate and Rhythm: Normal rate and regular rhythm.     Heart sounds: No murmur. No friction rub. No gallop.   Pulmonary:     Effort: Pulmonary effort is normal. No respiratory distress.     Breath sounds: No stridor. No wheezing, rhonchi or rales.  Abdominal:     General: There is no distension.     Palpations: Abdomen is soft. There is no mass.     Tenderness: There is abdominal tenderness. There is no  left CVA tenderness, guarding or rebound.     Hernia: No hernia is present.     Comments: Tender to palpation over the right CVA.  She has minimal tenderness to the right upper and lower abdomen.  No rebound or guarding.  Abdomen is obese, but soft and nondistended.  No left CVA tenderness.  No tenderness over McBurney's point.  Exam somewhat limited secondary to body habitus.  Musculoskeletal:     Right lower leg: No edema.     Left lower leg: No  edema.  Skin:    General: Skin is warm.     Findings: No rash.  Neurological:     Mental Status: She is alert.  Psychiatric:        Behavior: Behavior normal.      ED Treatments / Results  Labs (all labs ordered are listed, but only abnormal results are displayed) Labs Reviewed  COMPREHENSIVE METABOLIC PANEL - Abnormal; Notable for the following components:      Result Value   Potassium 3.4 (*)    CO2 21 (*)    Glucose, Bld 140 (*)    Creatinine, Ser 1.02 (*)    Calcium 8.4 (*)    All other components within normal limits  URINALYSIS, ROUTINE W REFLEX MICROSCOPIC - Abnormal; Notable for the following components:   APPearance CLOUDY (*)    Specific Gravity, Urine >1.030 (*)    Hgb urine dipstick TRACE (*)    Bilirubin Urine SMALL (*)    Protein, ur 30 (*)    All other components within normal limits  URINALYSIS, MICROSCOPIC (REFLEX) - Abnormal; Notable for the following components:   Bacteria, UA MANY (*)    All other components within normal limits  URINE CULTURE  LIPASE, BLOOD  CBC  I-STAT BETA HCG BLOOD, ED ([facility], [facility], [facility] ONLY)    EKG None  Radiology Ct Renal Stone Study  Result Date: 08/28/2018 CLINICAL DATA:  Acute onset of right-sided abdominal pain and right flank pain. EXAM: CT ABDOMEN AND PELVIS WITHOUT CONTRAST TECHNIQUE: Multidetector CT imaging of the abdomen and pelvis was performed following the standard protocol without IV contrast. COMPARISON:  CT of the abdomen and pelvis from 08/05/2016, and abdominal  radiograph performed 04/11/2018 FINDINGS: Lower chest: The visualized lung bases are grossly clear. The visualized portions of the mediastinum are unremarkable. Hepatobiliary: The liver is unremarkable in appearance. The patient is status post cholecystectomy, with clips noted at the gallbladder fossa. The common bile duct remains normal in caliber. Pancreas: The pancreas is within normal limits. Spleen: The spleen is enlarged, measuring 14.5 cm in length. Adrenals/Urinary Tract: The adrenal glands are unremarkable in appearance. There is mild-to-moderate right-sided hydronephrosis, with prominence of the right ureter along its entire course. An obstructing 6 x 4 mm stone is noted distally at the right vesicoureteral junction. Right-sided perinephric stranding is noted, and stranding is noted along the course of the right ureter. The left kidney is unremarkable in appearance. No nonobstructing renal stones are identified. Stomach/Bowel: The stomach is unremarkable in appearance. The small bowel is within normal limits. The appendix is normal in caliber, without evidence of appendicitis. The colon is unremarkable in appearance. Vascular/Lymphatic: The abdominal aorta is unremarkable in appearance. The inferior vena cava is grossly unremarkable. No retroperitoneal lymphadenopathy is seen. No pelvic sidewall lymphadenopathy is identified. Reproductive: The bladder is relatively decompressed and otherwise unremarkable. The uterus is unremarkable in appearance. The ovaries are relatively symmetric. No suspicious adnexal masses are seen. Other: No additional soft tissue abnormalities are seen. Musculoskeletal: No acute osseous abnormalities are identified. The visualized musculature is unremarkable in appearance. IMPRESSION: 1. Mild-to-moderate right-sided hydronephrosis, with an obstructing 6 x 4 mm stone noted distally at the right vesicoureteral junction. 2. Splenomegaly. Electronically Signed   By: Roanna RaiderJeffery  Chang M.D.    On: 08/28/2018 06:53    Procedures Procedures (including critical care time)  Medications Ordered in ED Medications  HYDROmorphone (DILAUDID) injection 1 mg (1 mg Intravenous Given 08/28/18 0725)  ondansetron (ZOFRAN) injection 4 mg (4 mg Intravenous  Given 08/28/18 0724)  ketorolac (TORADOL) 30 MG/ML injection 30 mg (30 mg Intravenous Given 08/28/18 0923)  diphenhydrAMINE (BENADRYL) injection 25 mg (25 mg Intravenous Given 08/28/18 1610)     Initial Impression / Assessment and Plan / ED Course  I have reviewed the triage vital signs and the nursing notes.  Pertinent labs & imaging results that were available during my care of the patient were reviewed by me and considered in my medical decision making (see chart for details).     32 year old female with a history of PCOS, GERD, asthma, morbid obesity, and OSA presenting with right flank pain that radiates around to the right abdomen and vomiting, onset last night.  States she was diagnosed with a kidney stone during an ED visit in August, but imaging performed at that time was unremarkable other than right-sided hydronephrosis.  No large stone.  She has not followed up with urology.  Will give pain medication and Zofran for nausea.  Labs are notable for small hemoglobinuria, bacteruria, and bilirubinuria.  Urine culture sent.  CT stone study with mild to moderate right-sided hydronephrosis and an obstructing 6 x 4 mm stone at the right vesicoureteral junction.  Discussed the patient with Dr. Liliane Shi who recommends a 1 week trial of passage at home.   Will discharge home with pain medication and antiemetic.  A 32-month prescription history query was performed using the [place] CSRS prior to discharge.  Referral to urology has been given.  Strict return precautions given.  She is hemodynamically stable and in no acute distress.  She is safe for discharge home with outpatient follow-up at this time.  Final Clinical Impressions(s) / ED Diagnoses     Final diagnoses:  Ureterolithiasis    ED Discharge Orders         Ordered    tamsulosin (FLOMAX) 0.4 MG CAPS capsule  Daily     08/28/18 0956    promethazine (PHENERGAN) 25 MG tablet  Every 8 hours PRN     08/28/18 0956    oxyCODONE-acetaminophen (PERCOCET/ROXICET) 5-325 MG tablet  Every 8 hours PRN     08/28/18 0956    ibuprofen (ADVIL,MOTRIN) 600 MG tablet  Every 6 hours PRN     08/28/18 0956           Barkley Boards, PA-C 08/28/18 1515    Molpus, John, MD 08/28/18 2252

## 2018-08-29 LAB — URINE CULTURE: SPECIAL REQUESTS: NORMAL

## 2019-04-01 IMAGING — MR MR HEAD W/O CM
9 series · 48 of 48 positions shown · non-contrast
Comparison: None.

CLINICAL DATA: 31 y/o F; daily migraines for the past 2-1/2 months.
Associated nausea and numbness.

EXAM:
MRI HEAD WITHOUT CONTRAST
TECHNIQUE: Multiplanar, multiecho pulse sequences of the brain and surrounding
structures were obtained without intravenous contrast.

[Series 2: t1_se_sag · sagittal · 5.0mm · 0.45mm/px · 3 of 21 slices shown]
[im 1/21]
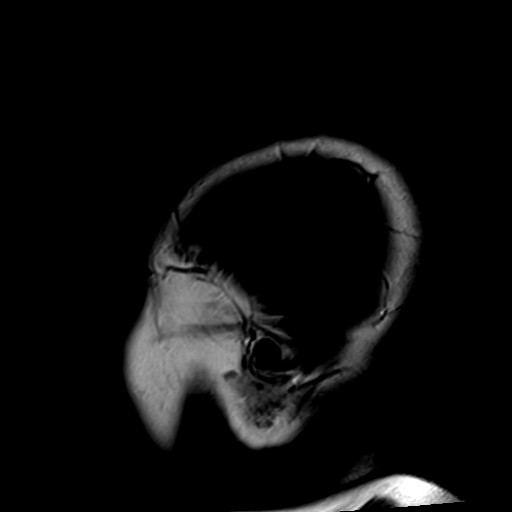
[im 11/21]
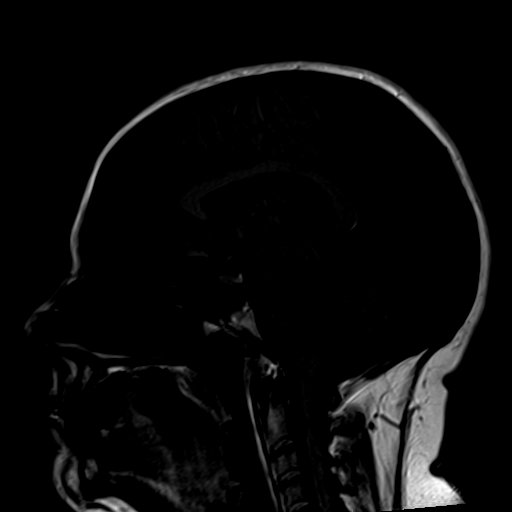
[im 21/21]
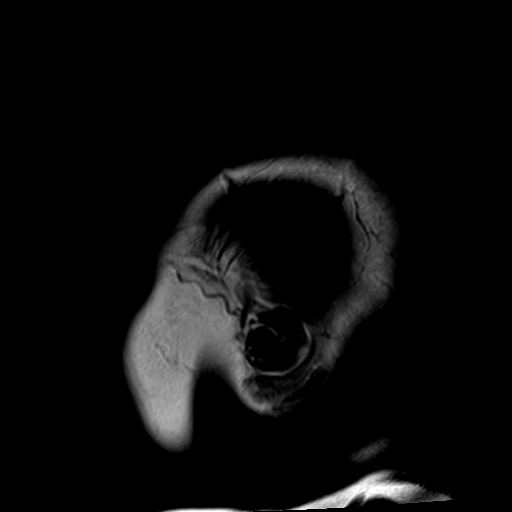

[Series 3: ep2d_diff_(id)_trace · axial · 3.0mm · 1.80mm/px · z∈[-52,+89]mm · 8 of 96 slices shown]
[im 1/96]
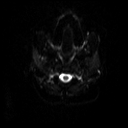
[im 14/96]
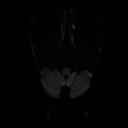
[im 28/96]
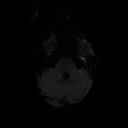
[im 41/96]
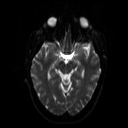
[im 55/96]
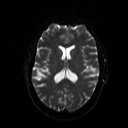
[im 68/96]
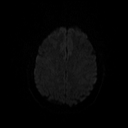
[im 82/96]
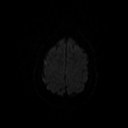
[im 96/96]
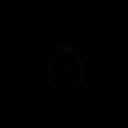

[Series 4: ep2d_diff_(id)_trace_adc · axial · 3.0mm · 1.80mm/px · z∈[-52,+89]mm · 4 of 48 slices shown]
[im 1/48]
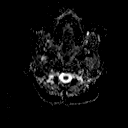
[im 16/48]
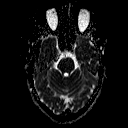
[im 32/48]
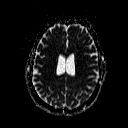
[im 48/48]
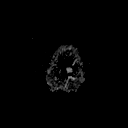

[Series 5: mip_images(sw) · axial · 16.0mm · 0.90mm/px · z∈[-53,+91]mm · 6 of 73 slices shown]
[im 1/73]
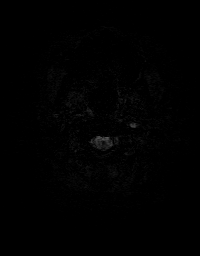
[im 15/73]
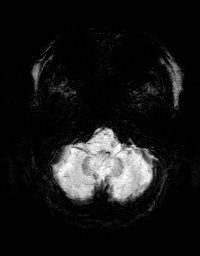
[im 29/73]
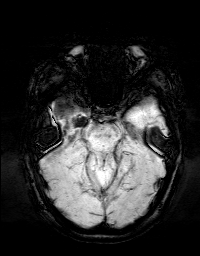
[im 44/73]
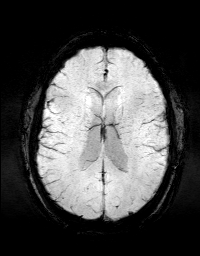
[im 58/73]
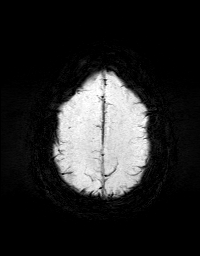
[im 73/73]
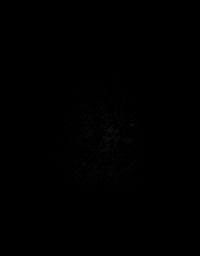

[Series 6: swi_images · axial · 2.0mm · 0.90mm/px · z∈[-60,+98]mm · 7 of 80 slices shown]
[im 1/80]
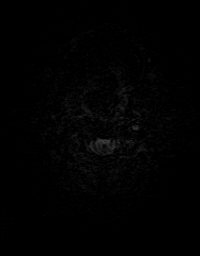
[im 14/80]
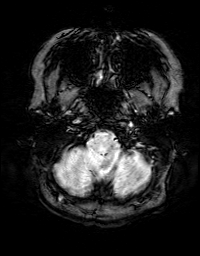
[im 27/80]
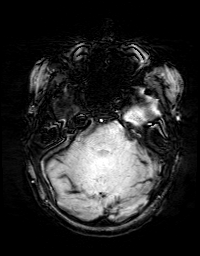
[im 40/80]
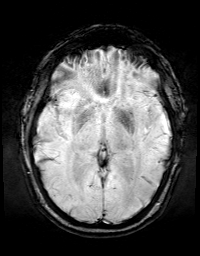
[im 53/80]
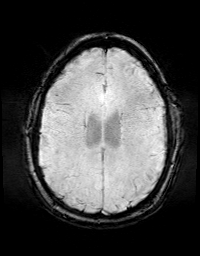
[im 66/80]
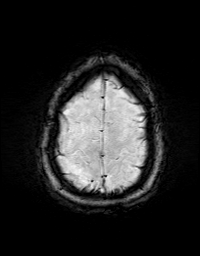
[im 80/80]
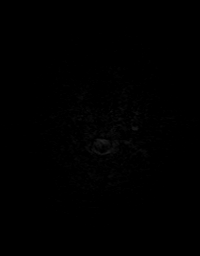

[Series 7: FLAIR · axial · 3.0mm · 0.43mm/px · z∈[-59,+96]mm · 2 of 27 slices shown]
[im 1/27]
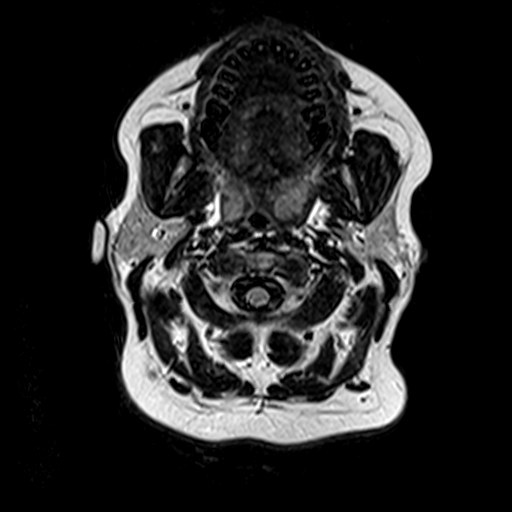
[im 27/27]
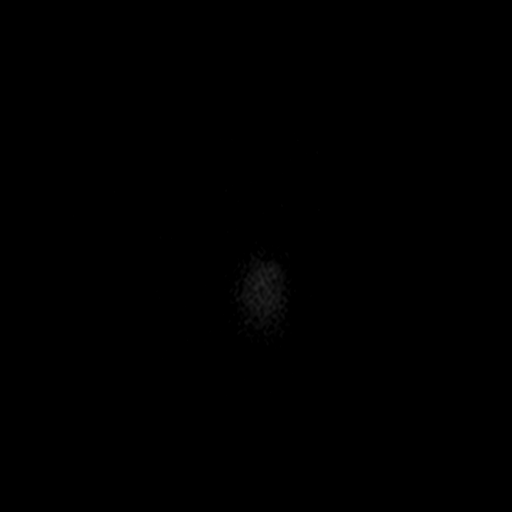

[Series 8: t2_tse_tra_512 · axial · 5.0mm · 0.60mm/px · z∈[-50,+88]mm · 2 of 24 slices shown]
[im 1/24]
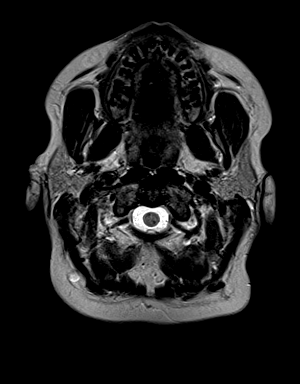
[im 24/24]
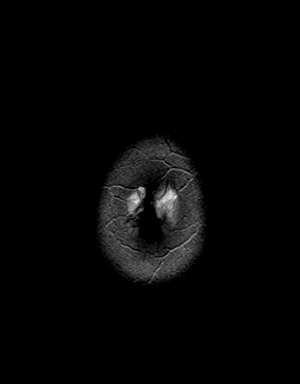

[Series 9: t1_mpr_tra · axial · 1.0mm · 0.72mm/px · z∈[-60,+99]mm · 14 of 160 slices shown]
[im 1/160]
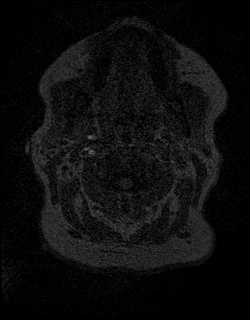
[im 13/160]
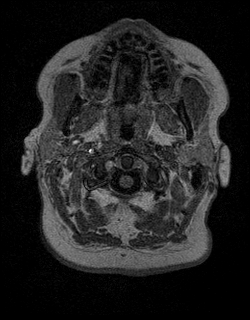
[im 25/160]
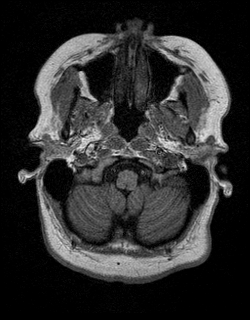
[im 37/160]
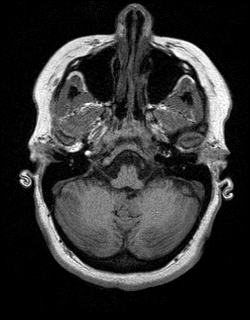
[im 49/160]
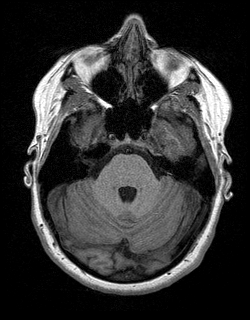
[im 62/160]
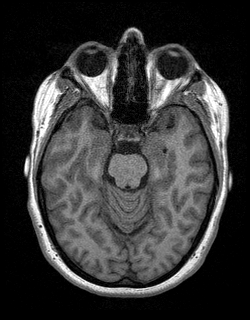
[im 74/160]
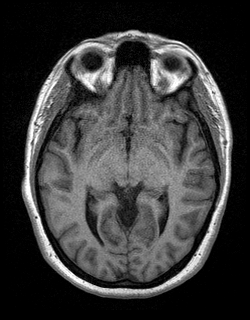
[im 86/160]
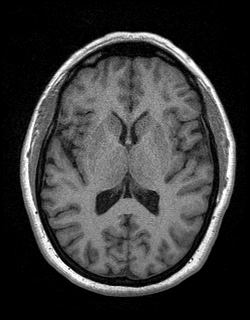
[im 98/160]
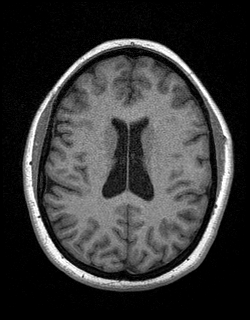
[im 111/160]
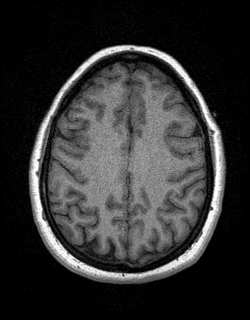
[im 123/160]
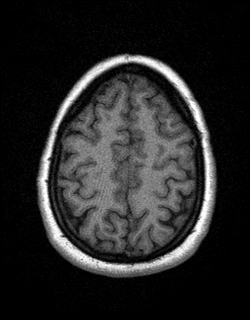
[im 135/160]
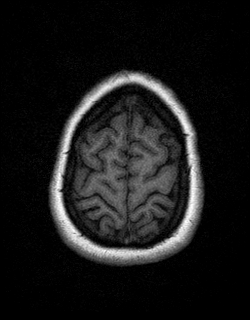
[im 147/160]
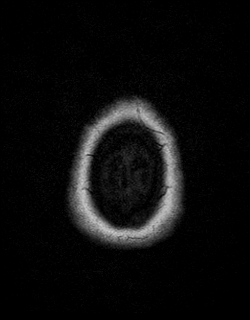
[im 160/160]
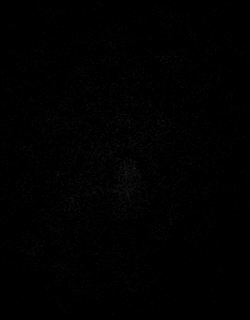

[Series 10: T2 · coronal · 5.0mm · 0.45mm/px · 2 of 26 slices shown]
[im 1/26]
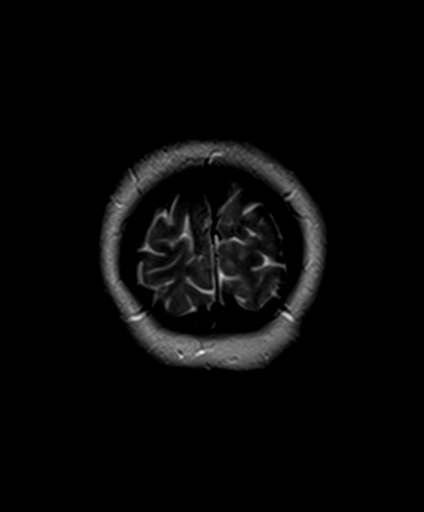
[im 26/26]
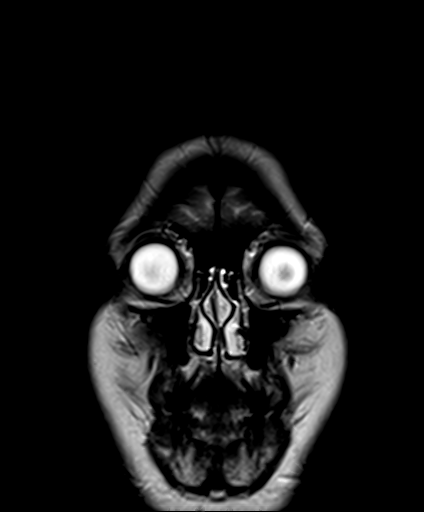

[48 of 48 positions shown; findings below may reference images not displayed]

FINDINGS: Brain: No acute infarction, hemorrhage, hydrocephalus, extra-axial
collection or mass lesion.

Vascular: Normal flow voids.

Skull and upper cervical spine: Normal marrow signal.

Sinuses/Orbits: Negative.

Other: None.
IMPRESSION: Normal MRI of the brain.

By: Zemra E Mallard M.D.
